# Patient Record
Sex: Female | Born: 1935 | Race: White | Hispanic: No | Marital: Married | Smoking: Never smoker
Health system: Southern US, Community
[De-identification: ages and names within clinical notes are randomized; demographics above are authoritative.]

## PROBLEM LIST (undated history)

## (undated) DIAGNOSIS — F0390 Unspecified dementia without behavioral disturbance: Secondary | ICD-10-CM

## (undated) DIAGNOSIS — I10 Essential (primary) hypertension: Secondary | ICD-10-CM

## (undated) DIAGNOSIS — E119 Type 2 diabetes mellitus without complications: Secondary | ICD-10-CM

## (undated) DIAGNOSIS — E039 Hypothyroidism, unspecified: Secondary | ICD-10-CM

## (undated) HISTORY — PX: TONSILLECTOMY: SUR1361

## (undated) HISTORY — PX: EYE SURGERY: SHX253

## (undated) HISTORY — PX: APPENDECTOMY: SHX54

## (undated) HISTORY — PX: MELANOMA EXCISION: SHX5266

## (undated) HISTORY — PX: CHOLECYSTECTOMY: SHX55

---

## 2004-06-13 ENCOUNTER — Ambulatory Visit: Payer: Self-pay | Admitting: Gastroenterology

## 2004-06-20 ENCOUNTER — Ambulatory Visit: Payer: Self-pay | Admitting: Unknown Physician Specialty

## 2004-10-11 ENCOUNTER — Ambulatory Visit: Payer: Self-pay | Admitting: Unknown Physician Specialty

## 2005-11-13 ENCOUNTER — Ambulatory Visit: Payer: Self-pay | Admitting: Unknown Physician Specialty

## 2006-11-25 ENCOUNTER — Ambulatory Visit: Payer: Self-pay | Admitting: Unknown Physician Specialty

## 2007-12-03 ENCOUNTER — Ambulatory Visit: Payer: Self-pay | Admitting: Unknown Physician Specialty

## 2008-12-07 ENCOUNTER — Ambulatory Visit: Payer: Self-pay | Admitting: Unknown Physician Specialty

## 2009-02-18 ENCOUNTER — Emergency Department: Payer: Self-pay | Admitting: Emergency Medicine

## 2009-05-22 ENCOUNTER — Ambulatory Visit: Payer: Self-pay | Admitting: Unknown Physician Specialty

## 2009-08-15 ENCOUNTER — Ambulatory Visit: Payer: Self-pay | Admitting: Ophthalmology

## 2009-08-21 ENCOUNTER — Ambulatory Visit: Payer: Self-pay | Admitting: Ophthalmology

## 2009-09-23 ENCOUNTER — Emergency Department: Payer: Self-pay | Admitting: Unknown Physician Specialty

## 2009-12-12 ENCOUNTER — Ambulatory Visit: Payer: Self-pay | Admitting: Unknown Physician Specialty

## 2010-07-07 IMAGING — CR DG SHOULDER 1V*R*
1 series · 3 of 3 positions shown · non-contrast
Comparison: none

REASON FOR EXAM: post reduction
COMMENTS:

PROCEDURE:     DXR - DXR SHOULDER RIGHT ONE VIEW  - September 23, 2009 [DATE]
RESULT:     Two views of the right shoulder are submitted. The dislocation
previously demonstrated is less conspicuous but still felt to be present.
There remains narrowing of the subacromial subdeltoid space.

[Series 1: view not recorded · 0.17mm/px · 3 of 3 slices shown]
[im 1/3]
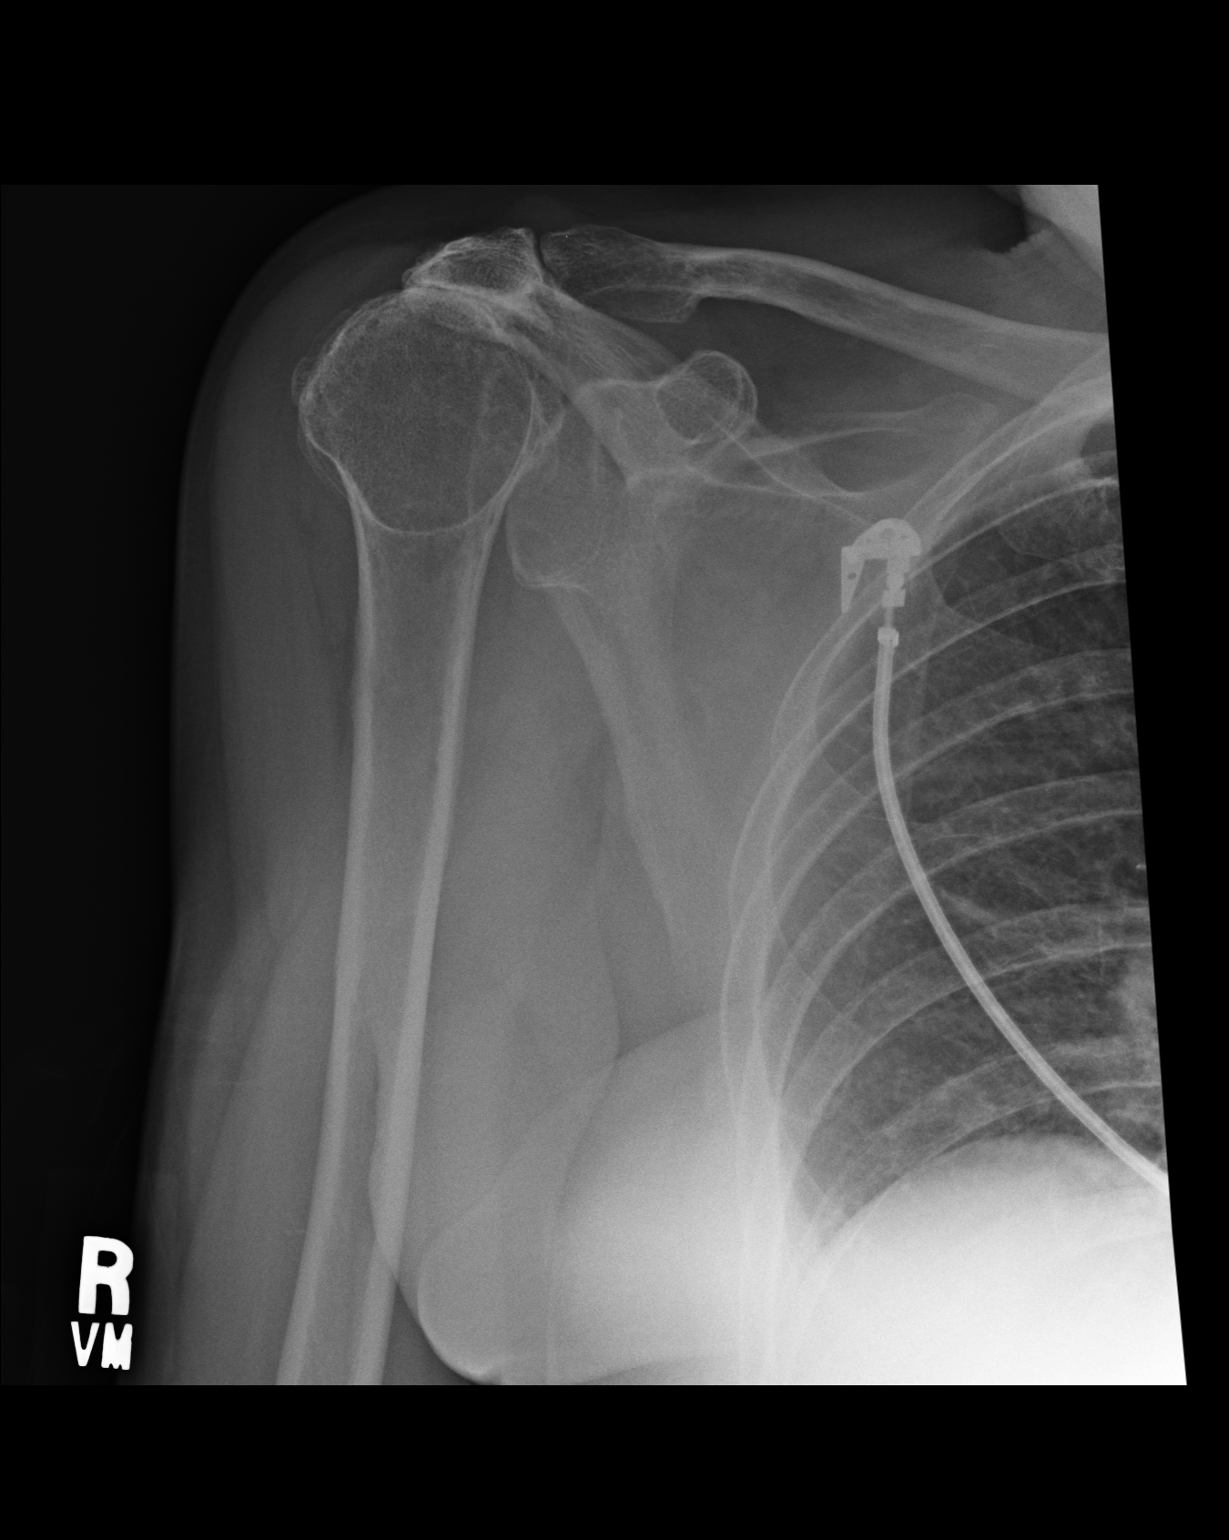
[im 2/3]
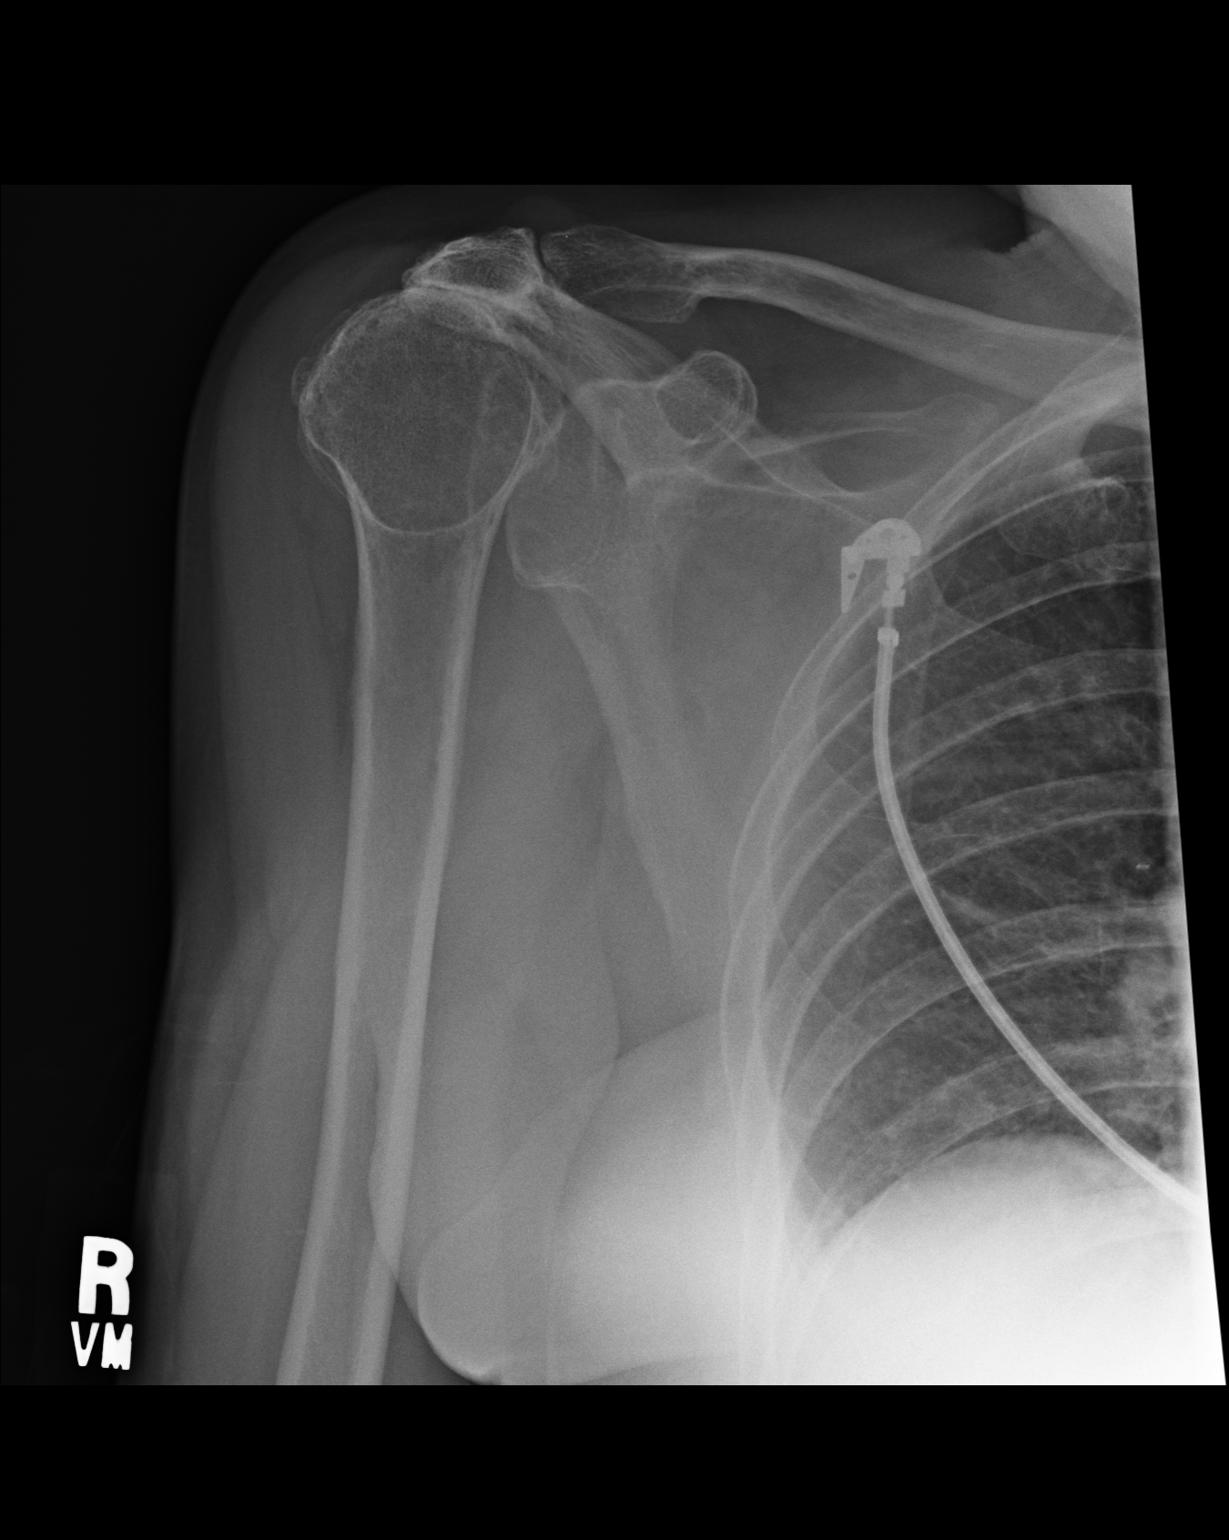
[im 3/3]
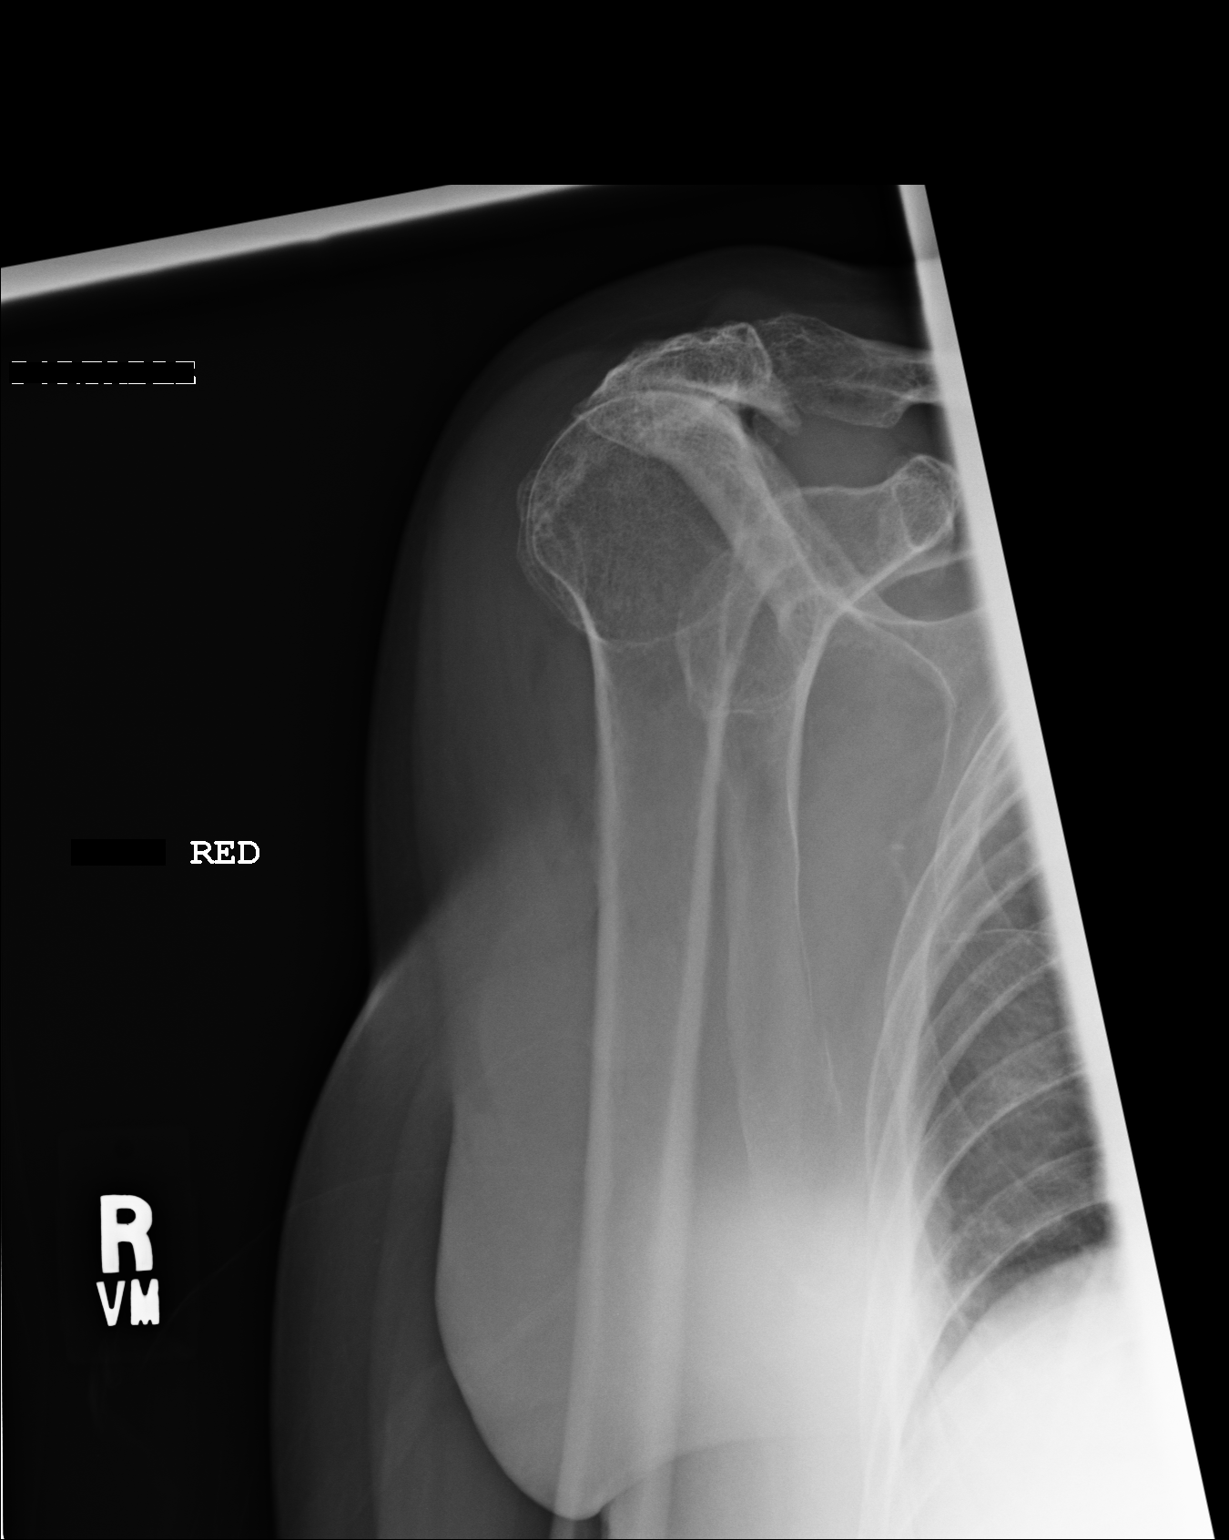

[3 of 3 positions shown; findings below may reference images not displayed]

IMPRESSION: The previously posteriorly dislocated right shoulder is not
clearly relocated.

## 2011-01-01 ENCOUNTER — Ambulatory Visit: Payer: Self-pay | Admitting: Unknown Physician Specialty

## 2011-05-13 ENCOUNTER — Emergency Department: Payer: Self-pay | Admitting: *Deleted

## 2011-05-13 LAB — COMPREHENSIVE METABOLIC PANEL
Anion Gap: 12 (ref 7–16)
Bilirubin,Total: 0.6 mg/dL (ref 0.2–1.0)
Chloride: 105 mmol/L (ref 98–107)
EGFR (African American): >60
Osmolality: 291 (ref 275–301)
Potassium: 4 mmol/L (ref 3.5–5.1)
SGOT(AST): 38 U/L — ABNORMAL HIGH (ref 15–37)
SGPT (ALT): 30 U/L
Total Protein: 7.2 g/dL (ref 6.4–8.2)

## 2011-05-13 LAB — CBC
HGB: 12.7 g/dL (ref 12.0–16.0)
MCH: 30.9 pg (ref 26.0–34.0)
MCV: 93 fL (ref 80–100)
Platelet: 162 10*3/uL (ref 150–440)
RBC: 4.13 10*6/uL (ref 3.80–5.20)
WBC: 4.2 10*3/uL (ref 3.6–11.0)

## 2011-05-13 LAB — TROPONIN I: Troponin-I: <0.02 ng/mL

## 2011-05-13 LAB — PRO B NATRIURETIC PEPTIDE: B-Type Natriuretic Peptide: 268 pg/mL (ref 0–450)

## 2011-05-13 LAB — CK TOTAL AND CKMB (NOT AT ARMC)
CK, Total: 233 U/L — ABNORMAL HIGH (ref 21–215)
CK-MB: 4.2 ng/mL — ABNORMAL HIGH (ref 0.5–3.6)

## 2011-06-11 ENCOUNTER — Encounter: Payer: Self-pay | Admitting: Rheumatology

## 2011-09-27 ENCOUNTER — Emergency Department: Payer: Self-pay | Admitting: *Deleted

## 2011-09-27 LAB — URINALYSIS, COMPLETE
Bilirubin,UR: NEGATIVE
Blood: NEGATIVE
Glucose,UR: NEGATIVE mg/dL
Hyaline Cast: 12
Ketone: NEGATIVE
Nitrite: NEGATIVE
Ph: 6
Protein: NEGATIVE
RBC,UR: NONE SEEN /HPF
Specific Gravity: 1.009
Squamous Epithelial: <1
Transitional Epi: <1
WBC UR: 30 /HPF

## 2011-09-27 LAB — CK TOTAL AND CKMB (NOT AT ARMC)
CK, Total: 221 U/L — ABNORMAL HIGH (ref 21–215)
CK-MB: 5.6 ng/mL — ABNORMAL HIGH (ref 0.5–3.6)

## 2011-09-27 LAB — COMPREHENSIVE METABOLIC PANEL
Albumin: 3.9 g/dL (ref 3.4–5.0)
Alkaline Phosphatase: 69 U/L (ref 50–136)
Anion Gap: 11 (ref 7–16)
Bilirubin,Total: 0.4 mg/dL (ref 0.2–1.0)
Co2: 21 mmol/L (ref 21–32)
EGFR (African American): 42 — ABNORMAL LOW
Osmolality: 252 (ref 275–301)
SGOT(AST): 37 U/L (ref 15–37)
SGPT (ALT): 33 U/L
Sodium: 124 mmol/L — ABNORMAL LOW (ref 136–145)
Total Protein: 6.7 g/dL (ref 6.4–8.2)

## 2011-09-27 LAB — TROPONIN I: Troponin-I: <0.02 ng/mL

## 2011-09-27 LAB — CBC
HCT: 34 % — ABNORMAL LOW (ref 35.0–47.0)
MCV: 88 fL (ref 80–100)
Platelet: 212 10*3/uL (ref 150–440)
RDW: 13.4 % (ref 11.5–14.5)
WBC: 4.9 10*3/uL (ref 3.6–11.0)

## 2011-09-27 LAB — TSH: Thyroid Stimulating Horm: 1.67 u[IU]/mL

## 2011-09-28 LAB — URINE CULTURE

## 2012-01-28 ENCOUNTER — Ambulatory Visit: Payer: Self-pay | Admitting: Physician Assistant

## 2012-03-17 ENCOUNTER — Other Ambulatory Visit: Payer: Self-pay | Admitting: Gastroenterology

## 2012-03-17 LAB — WBCS, STOOL

## 2012-05-06 ENCOUNTER — Ambulatory Visit: Payer: Self-pay | Admitting: Gastroenterology

## 2012-05-07 LAB — PATHOLOGY REPORT

## 2012-06-16 ENCOUNTER — Encounter: Payer: Self-pay | Admitting: Neurology

## 2012-06-20 ENCOUNTER — Encounter: Payer: Self-pay | Admitting: Neurology

## 2012-07-21 ENCOUNTER — Encounter: Payer: Self-pay | Admitting: Neurology

## 2013-02-02 ENCOUNTER — Ambulatory Visit: Payer: Self-pay | Admitting: Physician Assistant

## 2013-04-20 ENCOUNTER — Ambulatory Visit: Payer: Self-pay | Admitting: Gastroenterology

## 2014-02-03 ENCOUNTER — Ambulatory Visit: Payer: Self-pay | Admitting: Physician Assistant

## 2014-04-24 ENCOUNTER — Emergency Department: Payer: Self-pay | Admitting: Emergency Medicine

## 2014-04-24 LAB — URINALYSIS, COMPLETE
Bacteria: NONE SEEN
Bilirubin,UR: NEGATIVE
Blood: NEGATIVE
Glucose,UR: NEGATIVE mg/dL (ref 0–75)
Ketone: NEGATIVE
NITRITE: NEGATIVE
PROTEIN: NEGATIVE
Ph: 5 (ref 4.5–8.0)
SPECIFIC GRAVITY: 1.014 (ref 1.003–1.030)
Squamous Epithelial: 1

## 2014-04-24 LAB — CBC WITH DIFFERENTIAL/PLATELET
Basophil #: 0.1 10*3/uL (ref 0.0–0.1)
Basophil %: 1 %
EOS ABS: 0.1 10*3/uL (ref 0.0–0.7)
Eosinophil %: 1.9 %
HCT: 35.7 % (ref 35.0–47.0)
HGB: 11.6 g/dL — ABNORMAL LOW (ref 12.0–16.0)
LYMPHS ABS: 1 10*3/uL (ref 1.0–3.6)
Lymphocyte %: 18.1 %
MCH: 30.1 pg (ref 26.0–34.0)
MCHC: 32.6 g/dL (ref 32.0–36.0)
MCV: 92 fL (ref 80–100)
MONO ABS: 0.3 x10 3/mm (ref 0.2–0.9)
MONOS PCT: 6 %
NEUTROS ABS: 4.1 10*3/uL (ref 1.4–6.5)
Neutrophil %: 73 %
PLATELETS: 227 10*3/uL (ref 150–440)
RBC: 3.87 10*6/uL (ref 3.80–5.20)
RDW: 13.2 % (ref 11.5–14.5)
WBC: 5.6 10*3/uL (ref 3.6–11.0)

## 2014-04-24 LAB — BASIC METABOLIC PANEL
ANION GAP: 8 (ref 7–16)
BUN: 19 mg/dL — ABNORMAL HIGH (ref 7–18)
CO2: 25 mmol/L (ref 21–32)
CREATININE: 1.36 mg/dL — AB (ref 0.60–1.30)
Calcium, Total: 8.5 mg/dL (ref 8.5–10.1)
Chloride: 106 mmol/L (ref 98–107)
GFR CALC AF AMER: 48 — AB
GFR CALC NON AF AMER: 39 — AB
GLUCOSE: 154 mg/dL — AB (ref 65–99)
Osmolality: 283 (ref 275–301)
Potassium: 4.1 mmol/L (ref 3.5–5.1)
Sodium: 139 mmol/L (ref 136–145)

## 2014-04-24 LAB — TROPONIN I: Troponin-I: <0.02 ng/mL

## 2014-05-09 ENCOUNTER — Encounter: Payer: Self-pay | Admitting: Physician Assistant

## 2014-05-23 ENCOUNTER — Encounter: Payer: Self-pay | Admitting: Physician Assistant

## 2014-06-21 ENCOUNTER — Encounter
Admit: 2014-06-21 | Disposition: A | Discharge status: Home / Self Care | Payer: Self-pay | Attending: Physician Assistant | Admitting: Physician Assistant

## 2014-07-22 ENCOUNTER — Encounter
Admit: 2014-07-22 | Disposition: A | Discharge status: Home / Self Care | Payer: Self-pay | Attending: Physician Assistant | Admitting: Physician Assistant

## 2015-01-05 ENCOUNTER — Emergency Department: Payer: Medicare Other

## 2015-01-05 ENCOUNTER — Other Ambulatory Visit: Payer: Self-pay | Admitting: Emergency Medicine

## 2015-01-05 ENCOUNTER — Inpatient Hospital Stay
Admission: EM | Admit: 2015-01-05 | Discharge: 2015-01-09 | DRG: 494 | Disposition: A | Discharge status: Skilled Nursing Facility | Payer: Medicare Other | Attending: Internal Medicine | Admitting: Internal Medicine

## 2015-01-05 ENCOUNTER — Encounter: Payer: Self-pay | Admitting: Internal Medicine

## 2015-01-05 DIAGNOSIS — M25572 Pain in left ankle and joints of left foot: Secondary | ICD-10-CM | POA: Diagnosis present

## 2015-01-05 DIAGNOSIS — Z809 Family history of malignant neoplasm, unspecified: Secondary | ICD-10-CM

## 2015-01-05 DIAGNOSIS — Z8249 Family history of ischemic heart disease and other diseases of the circulatory system: Secondary | ICD-10-CM | POA: Diagnosis not present

## 2015-01-05 DIAGNOSIS — R519 Headache, unspecified: Secondary | ICD-10-CM

## 2015-01-05 DIAGNOSIS — S82852A Displaced trimalleolar fracture of left lower leg, initial encounter for closed fracture: Principal | ICD-10-CM | POA: Diagnosis present

## 2015-01-05 DIAGNOSIS — E039 Hypothyroidism, unspecified: Secondary | ICD-10-CM | POA: Diagnosis not present

## 2015-01-05 DIAGNOSIS — Z9181 History of falling: Secondary | ICD-10-CM

## 2015-01-05 DIAGNOSIS — E119 Type 2 diabetes mellitus without complications: Secondary | ICD-10-CM | POA: Diagnosis not present

## 2015-01-05 DIAGNOSIS — I10 Essential (primary) hypertension: Secondary | ICD-10-CM | POA: Diagnosis not present

## 2015-01-05 DIAGNOSIS — Z419 Encounter for procedure for purposes other than remedying health state, unspecified: Secondary | ICD-10-CM

## 2015-01-05 DIAGNOSIS — S80812A Abrasion, left lower leg, initial encounter: Secondary | ICD-10-CM

## 2015-01-05 DIAGNOSIS — F0390 Unspecified dementia without behavioral disturbance: Secondary | ICD-10-CM | POA: Diagnosis present

## 2015-01-05 DIAGNOSIS — Z8781 Personal history of (healed) traumatic fracture: Secondary | ICD-10-CM

## 2015-01-05 DIAGNOSIS — Z9889 Other specified postprocedural states: Secondary | ICD-10-CM

## 2015-01-05 DIAGNOSIS — R51 Headache: Secondary | ICD-10-CM

## 2015-01-05 DIAGNOSIS — W19XXXA Unspecified fall, initial encounter: Secondary | ICD-10-CM | POA: Diagnosis present

## 2015-01-05 DIAGNOSIS — E785 Hyperlipidemia, unspecified: Secondary | ICD-10-CM | POA: Diagnosis not present

## 2015-01-05 HISTORY — DX: Hypothyroidism, unspecified: E03.9

## 2015-01-05 HISTORY — DX: Unspecified dementia, unspecified severity, without behavioral disturbance, psychotic disturbance, mood disturbance, and anxiety: F03.90

## 2015-01-05 HISTORY — DX: Type 2 diabetes mellitus without complications: E11.9

## 2015-01-05 HISTORY — DX: Essential (primary) hypertension: I10

## 2015-01-05 LAB — CBC
HEMATOCRIT: 35.1 % (ref 35.0–47.0)
Hemoglobin: 11.6 g/dL — ABNORMAL LOW (ref 12.0–16.0)
MCH: 30.2 pg (ref 26.0–34.0)
MCHC: 33 g/dL (ref 32.0–36.0)
MCV: 91.4 fL (ref 80.0–100.0)
Platelets: 205 10*3/uL (ref 150–440)
RBC: 3.84 MIL/uL (ref 3.80–5.20)
RDW: 13.5 % (ref 11.5–14.5)
WBC: 8.8 10*3/uL (ref 3.6–11.0)

## 2015-01-05 LAB — PROTIME-INR
INR: 0.98
Prothrombin Time: 13.2 seconds (ref 11.4–15.0)

## 2015-01-05 LAB — APTT: aPTT: 30 seconds (ref 24–36)

## 2015-01-05 LAB — BASIC METABOLIC PANEL
ANION GAP: 7 (ref 5–15)
BUN: 29 mg/dL — ABNORMAL HIGH (ref 6–20)
CALCIUM: 9 mg/dL (ref 8.9–10.3)
CO2: 26 mmol/L (ref 22–32)
Chloride: 103 mmol/L (ref 101–111)
Creatinine, Ser: 1.31 mg/dL — ABNORMAL HIGH (ref 0.44–1.00)
GFR calc Af Amer: 41 mL/min — ABNORMAL LOW (ref >60)
GFR calc non Af Amer: 36 mL/min — ABNORMAL LOW (ref >60)
GLUCOSE: 175 mg/dL — AB (ref 65–99)
Potassium: 4.3 mmol/L (ref 3.5–5.1)
Sodium: 136 mmol/L (ref 135–145)

## 2015-01-05 MED ORDER — TETANUS-DIPHTH-ACELL PERTUSSIS 5-2.5-18.5 LF-MCG/0.5 IM SUSP
0.5000 mL | Freq: Once | INTRAMUSCULAR | Status: AC
  Administered 2015-01-05: 0.5 mL via INTRAMUSCULAR
  Filled 2015-01-05: qty 0.5

## 2015-01-05 NOTE — H&P (Signed)
[facility] - [place] at [facility]   PATIENT NAME: Sara Delgado    MR#:  [id number]  DATE OF BIRTH:  1926/09/02  DATE OF ADMISSION:  01/05/2015  PRIMARY CARE PHYSICIAN: No primary care provider on file.   REQUESTING/REFERRING PHYSICIAN: Dr. Sharma Delgado  CHIEF COMPLAINT:   Chief Complaint  Patient presents with  . Ankle Pain    HISTORY OF PRESENT ILLNESS:  Sara Delgado  is a 81 y.o. female with a known history of hypertension, dementia, diet-controlled diabetes mellitus, hyperlipidemia, hypothyroidism, history of recurrent falls presents to the emergency room with the complaint of left ankle pain and swelling following mechanical fall sustained while taking a shower in the afternoon today. No history of any loss of consciousness, chest pain, shortness of breath, dizziness preceding or following the fall. She did have some headache following fall which resolved after taking Tylenol. Evaluation in the ED revealed mildly displaced trimalleolar fracture of left ankle. Ortho on call was consulted by the ED physician, evaluated by Ortho and placed on cast. Since patient could not bear weight and with history of recurrent falls and potential for further falls, it was decided by the ED physician to admit the patient to medicine service for further care including further evaluation by Ortho in the morning for possible surgery since post reduction x-rays are not satisfactory for the ortho. Patient also underwent a CT head which was unremarkable. Hospitalist service was consulted for further management. Patient is comfortably resting in the bed at this time and denies any complaints such as dizziness, headache, visual disturbances, chest pain, shortness of breath.  PAST MEDICAL HISTORY:   Past Medical History  Diagnosis Date  . Hypertension   . Diabetes mellitus without complication   . Hypothyroidism   . Dementia     PAST SURGICAL HISTORY:   Past Surgical History  Procedure  Laterality Date  . Appendectomy    . Eye surgery    . Tonsillectomy    . Cholecystectomy      SOCIAL HISTORY:   Social History  Substance Use Topics  . Smoking status: Never Smoker   . Smokeless tobacco: Not on file  . Alcohol Use: No    FAMILY HISTORY:   Family History  Problem Relation Age of Onset  . CAD Mother   . CAD Father   . Dementia Sister   . CAD Brother   . Cancer Brother     DRUG ALLERGIES:  Not on File  REVIEW OF SYSTEMS:   Review of Systems  Constitutional: Negative for fever, chills and malaise/fatigue.  HENT: Negative for ear pain, hearing loss, nosebleeds, sore throat and tinnitus.   Eyes: Negative for blurred vision, double vision, pain, discharge and redness.  Respiratory: Negative for cough, hemoptysis, sputum production, shortness of breath and wheezing.   Cardiovascular: Negative for chest pain, palpitations, orthopnea and leg swelling.  Gastrointestinal: Negative for nausea, vomiting, abdominal pain, diarrhea, constipation, blood in stool and melena.  Genitourinary: Negative for dysuria, urgency, frequency and hematuria.  Musculoskeletal: Positive for joint pain. Negative for back pain and neck pain.       Left ankle pain and swelling following mechanical fall as noted in history of present illness  Skin: Negative for itching and rash.  Neurological: Negative for dizziness, tingling, sensory change, focal weakness and seizures.  Endo/Heme/Allergies: Does not bruise/bleed easily.  Psychiatric/Behavioral: Positive for memory loss. Negative for depression. The patient is not nervous/anxious.     MEDICATIONS AT HOME:   Prior  to Admission medications   Not on File      VITAL SIGNS:  Blood pressure 125/68, pulse 66, temperature 98.1 F (36.7 C), temperature source Oral, resp. rate 16, height  (1.676 m), weight 61.236 kg (135 lb), SpO2 100 %.  PHYSICAL EXAMINATION:  Physical Exam  Constitutional: She is oriented to person, place, and  time. She appears well-developed and well-nourished. No distress.  HENT:  Head: Normocephalic and atraumatic.  Right Ear: External ear normal.  Left Ear: External ear normal.  Nose: Nose normal.  Mouth/Throat: Oropharynx is clear and moist. No oropharyngeal exudate.  Eyes: EOM are normal. Pupils are equal, round, and reactive to light. No scleral icterus.  Neck: Normal range of motion. Neck supple. No JVD present. No thyromegaly present.  Cardiovascular: Normal rate, regular rhythm, normal heart sounds and intact distal pulses.  Exam reveals no friction rub.   No murmur heard. Respiratory: Effort normal and breath sounds normal. No respiratory distress. She has no wheezes. She has no rales. She exhibits no tenderness.  GI: Soft. Bowel sounds are normal. She exhibits no distension and no mass. There is no tenderness. There is no rebound and no guarding.  Musculoskeletal: She exhibits no edema.  Left ankle and leg is in cast.  Lymphadenopathy:    She has no cervical adenopathy.  Neurological: She is alert and oriented to person, place, and time. She has normal reflexes. She displays normal reflexes. No cranial nerve deficit. She exhibits normal muscle tone.  Skin: Skin is warm. No rash noted. No erythema.  Psychiatric: She has a normal mood and affect. Her behavior is normal. Thought content normal.   LABORATORY PANEL:   CBC  Recent Labs Lab 01/05/15 2042  WBC 8.8  HGB 11.6*  HCT 35.1  PLT 205   ------------------------------------------------------------------------------------------------------------------  Chemistries   Recent Labs Lab 01/05/15 2042  NA 136  K 4.3  CL 103  CO2 26  GLUCOSE 175*  BUN 29*  CREATININE 1.31*  CALCIUM 9.0   ------------------------------------------------------------------------------------------------------------------  Cardiac Enzymes No results for input(s): TROPONINI in the last 168  hours. ------------------------------------------------------------------------------------------------------------------  RADIOLOGY:  Dg Ankle Complete Left  01/05/2015   CLINICAL DATA:  Post reduction.  EXAM: LEFT ANKLE COMPLETE - 3+ VIEW  COMPARISON:  Earlier today.  FINDINGS: Overlying cast obscures evaluation of bony detail. Minimally displaced medial malleolar fracture unchanged. Min displaced oblique fracture of the distal fibular diametaphyseal region without significant change. For a nondisplaced posterior malleolar fracture without significant change. Slight widening of the medial aspect of the ankle mortise unchanged.  IMPRESSION: Minimally displaced trimalleolar fracture without significant change post reduction and casting. Slight widening of the medial aspect of the ankle mortise unchanged.   Electronically Signed   By: Elberta Fortis M.D.   On: 01/05/2015 23:24   Dg Ankle Complete Left  01/05/2015   CLINICAL DATA:  Left ankle pain after fall today. Fall in the shower, now with swelling and bruising. Initial encounter.  EXAM: LEFT ANKLE COMPLETE - 3+ VIEW  COMPARISON:  None.  FINDINGS: Oblique mildly displaced fracture of the distal fibula proximal to the ankle mortise. Mildly displaced medial malleolar fracture. Cortical irregularity of the posterior tibial tubercle consistent with nondisplaced fracture. The bones are under mineralized. There is widening of the medial clear space. There is diffuse soft tissue edema about the ankle.  IMPRESSION: Mildly displaced trimalleolar fracture.   Electronically Signed   By: Rubye Oaks M.D.   On: 01/05/2015 21:01   Ct Head Wo  Contrast  01/05/2015   CLINICAL DATA:  81 year old female post fall. No loss of consciousness.  EXAM: CT HEAD WITHOUT CONTRAST  TECHNIQUE: Contiguous axial images were obtained from the base of the skull through the vertex without intravenous contrast.  COMPARISON:  04/24/2014  FINDINGS: Age-related atrophy, unchanged. No  intracranial hemorrhage, mass effect, or midline shift. No hydrocephalus. The basilar cisterns are patent. No evidence of territorial infarct. No intracranial fluid collection. Calvarium is intact. Included paranasal sinuses and mastoid air cells are well aerated.  IMPRESSION: No acute intracranial abnormality.   Electronically Signed   By: Rubye Oaks M.D.   On: 01/05/2015 23:16    EKG:   Orders placed or performed during the hospital encounter of 01/05/15  . ED EKG  . ED EKG  . EKG 12-Lead  . EKG 12-Lead  Normal sinus rhythm with ventricular rate of 60 bpm, left axis deviation. No acute ischemic changes.  IMPRESSION AND PLAN:   81 year old Caucasian female with history of hypertension, dementia, diet-controlled diabetes mellitus presents to the emergency room with the complaints of left ankle pain and swelling following mechanical fall, found to have mildly displaced trimalleolar fracture left ankle. 1. Trimalleolar fracture left ankle following mechanical fall, evaluated by ortho on-call, placed on Cast. Patient not able to bear weight, admitted for further continuation of care including for possible surgery in the morning. Plan: Admit, place nothing by mouth after midnight for possible surgery in the morning, orthopedic follow-up, pain control meds as needed. 2. Hypertension, stable on home medications. 3. Dementia, stable on home medications.   4. Diet-controlled diabetes mellitus, stable clinically. Sliding scale insulin, follow blood sugars closely. 5. Hyperlipidemia, stable on home medications. 6. Hypothyroidism, stable on home medications. Obtain list of home medications and restart home medications. DVT prophylaxis: We will not place on subcutaneous heparin since orthopedic planning for possible surgery in the morning. We will order SCDs.    All the records are reviewed and case discussed with ED provider. Management plans discussed with the patient, family and they are in  agreement.  CODE STATUS: full code  TOTAL TIME TAKING CARE OF THIS PATIENT: 50 minutes.    Crissie Figures M.D on 01/05/2015 at 11:49 PM  Between 7am to 6pm - Pager - [phone]  After 6pm go to [email or web address] - password [email or web address] [facility]  [facility]  Office  [phone]  CC: Primary care physician; No primary care provider on file.

## 2015-01-05 NOTE — Consult Note (Signed)
ORTHOPAEDIC CONSULTATION  REQUESTING PHYSICIAN: Rockne Menghini, MD  Chief Complaint:   Left ankle pain.  History of Present Illness: Sara Delgado is a 81 y.o. female with a history of early memory loss and poor balance who lives independently at home with her husband. Apparently, while taking a shower this afternoon around 3:30, she had the urge to go to the bathroom. While trying to get out of the shower, she slipped and fell, injuring her ankle. Her husband found her in the shower. The patient was complaining of a headache and appeared pale. The patient's husband waited approximately 15-20 minutes until she "got her strength back" that helped her into the study and covered her with some towels to keep her warm. The patient felt better and insisted that she go out to dinner with her daughter as planned. Because of continued left ankle pain, she was brought to the emergency room by her family where x-rays demonstrated a minimally displaced trimalleolar fracture dislocation of her left ankle. The patient denies any actual loss of consciousness as a result of the fall and denies any other injuries resulting from the fall. She denies any lightheadedness, dizziness, or other symptoms that may have predisposed her to the fall.  No past medical history on file. No past surgical history on file. Social History   Social History  . Marital Status: Married    Spouse Name: N/A  . Number of Children: N/A  . Years of Education: N/A   Social History Main Topics  . Smoking status: Not on file  . Smokeless tobacco: Not on file  . Alcohol Use: Not on file  . Drug Use: Not on file  . Sexual Activity: Not on file   Other Topics Concern  . Not on file   Social History Narrative  . No narrative on file   No family history on file. Allergies not on file Prior to Admission medications   Not on File   Dg Ankle Complete  Left  01/05/2015   CLINICAL DATA:  Left ankle pain after fall today. Fall in the shower, now with swelling and bruising. Initial encounter.  EXAM: LEFT ANKLE COMPLETE - 3+ VIEW  COMPARISON:  None.  FINDINGS: Oblique mildly displaced fracture of the distal fibula proximal to the ankle mortise. Mildly displaced medial malleolar fracture. Cortical irregularity of the posterior tibial tubercle consistent with nondisplaced fracture. The bones are under mineralized. There is widening of the medial clear space. There is diffuse soft tissue edema about the ankle.  IMPRESSION: Mildly displaced trimalleolar fracture.   Electronically Signed   By: Rubye Oaks M.D.   On: 01/05/2015 21:01   Positive ROS: All other systems have been reviewed and were otherwise negative with the exception of those mentioned in the HPI and as above.  Physical Exam: General:  Alert, no acute distress Psychiatric:  Patient is competent for consent with normal mood and affect   Cardiovascular:  No pedal edema Respiratory:  No wheezing, non-labored breathing GI:  Abdomen is soft and non-tender Skin:  No lesions in the area of chief complaint Neurologic:  Sensation intact distally Lymphatic:  No axillary or cervical lymphadenopathy  Orthopedic Exam:  Orthopedic examination is limited to the left lower extremity and foot. There is an area of mild bruising over the medial malleolus, but there are no areas of lacerations, abrasions, or rashes over the medial or lateral malleolar regions. She does have a very small superficial laceration over the mid shin anteriorly, approximately 6  inches above the ankle. She has moderate tenderness to palpation over the medial and lateral malleolar regions, as well as pain with any attempted active or passive motion of the ankle. She is able to actively dorsiflex and plantarflex her toes. She has intact sensation to light touch over all distributions of her foot, and has excellent capillary refill to  all digits.  X-rays:  AP, lateral, and oblique films of the left ankle are available for review. These films demonstrate a mildly displaced trimalleolar fracture of the left ankle with several millimeters of lateral translation of the talus from beneath the tibia.  Assessment: Mildly displaced trimalleolar fracture dislocation left ankle.  Plan: The treatment options are discussed with the patient and her family. She would like to try to proceed with nonsurgical intervention if at all possible, given her age. Therefore, after obtaining verbal consent, the patient's ankle is gently reduced and stabilized in a posterior splint with a sugar tong supplement. The patient is advised to keep her leg elevated as much as possible and to remain nonweightbearing.    [name], MD  Beeper #:  [phone]  01/05/2015 9:55 PM

## 2015-01-05 NOTE — ED Notes (Signed)
Pt to ED c/o falling in shower that caused her to have noted swelling and bruising to medial side of L ankle.  Pt denies any loss of consciousness.

## 2015-01-05 NOTE — ED Provider Notes (Addendum)
[facility] Emergency Department Provider Note  ____________________________________________  Time seen: Approximately 10:09 PM  I have reviewed the triage vital signs and the nursing notes.   HISTORY  Chief Complaint Ankle Pain  History is minimally limited by dementia, there are some portions of tonight's fall that the patient does not remember. However, her husband who accompanies her did find her immediately and can describe the events that occurred after the fall.  HPI Sara Delgado is a 81 y.o. female with a history of dementia and recurrent falls, who lives independently with her husband, is presents today with left ankle pain after fall. The patient describes that she was in the shower around 3 PM and wanted to have a bowel movement, and slipped as she was attempting to get out of the shower. She does not recall that she hit her head but does not remember the exact chain of events. Her husband found her in the bathroom on the floor floor, stating that she was pale but did know who she was and regained enough strength to help stand up within approximately 15 minutes. At that time, the patient was complaining of a headache, which has completely resolved with 650 mg of Tylenol. The patient has had some burping this evening, but has not had any more headache, visual changes, speech changes, numbness tingling or weakness, chest pain, shortness of breath, or recent illness.. She is able to bear weight on her left ankle. After the fall, she went out to dinner with her daughter and was able to eat her entire meal.  The patient and her daughter do not know her medications, so it is unclear whether she is anticoagulated at the family does not think so.   No past medical history on file.  There are no active problems to display for this patient.   No past surgical history on file.  No current outpatient prescriptions on file.  Allergies Review of patient's allergies  indicates not on file.  No family history on file.  Social History Social History  Substance Use Topics  . Smoking status: Not on file  . Smokeless tobacco: Not on file  . Alcohol Use: Not on file    Review of Systems Constitutional: No fever/chills Eyes: No visual changes. ENT: No sore throat. No neck pain. Cardiovascular: Denies chest pain, palpitations. No lightheadedness, syncope. Respiratory: Denies shortness of breath.  No cough. Gastrointestinal: No abdominal pain.  No nausea, no vomiting.  No diarrhea.  No constipation. Genitourinary: Negative for dysuria. Musculoskeletal: Negative for back pain. Plus left ankle pain. Skin: Negative for rash. Neurological: Negative for headaches, focal weakness or numbness.  10-point ROS otherwise negative.  ____________________________________________   PHYSICAL EXAM:  VITAL SIGNS: ED Triage Vitals  Enc Vitals Group     BP 01/05/15 2021 137/59 mmHg     Pulse Rate 01/05/15 2021 63     Resp 01/05/15 2021 18     Temp 01/05/15 2021 98.1 F (36.7 C)     Temp Source 01/05/15 2021 Oral     SpO2 01/05/15 2021 100 %     Weight 01/05/15 2021 135 lb (61.236 kg)     Height 01/05/15 2021  (1.676 m)     Head Cir --      [facility]     Pain Loc --      Pain Edu? --      Excl. in GC? --  Constitutional: Alert and oriented. Well appearing and in no acute distress.  Eyes: Conjunctivae are normal.  EOMI. no raccoon eyes. Head: Atraumatic. No Battle sign. Bilateral TMs are clear without hemotympanum. No evidence of malocclusion or dental injury. No septal hematoma. Nose: No congestion/rhinnorhea. Mouth/Throat: Mucous membranes are moist.  Neck: No stridor.  Supple.  No midline C-spine tenderness. Full range of motion without any pain. Cardiovascular: Normal rate, regular rhythm. No murmurs, rubs or gallops.  Respiratory: Normal respiratory effort.  No retractions. Lungs CTAB.  No wheezes, rales  or ronchi. Gastrointestinal: Soft and nontender. No distention. No peritoneal signs. Musculoskeletal: Left ankle shows some swelling more prominent over the lateral malleolus and the medial malleolus. The patient has some pain with range of motion. Full range of motion of the left knee and left hip without pain. Skin over the ankle is intact. Patient does have a small abrasion over the distal tibia that is superficial. Normal DP and PT pulses normal sensation to light touch over the tire left lower extremity. Neurologic:  Normal speech and language. No gross focal neurologic deficits are appreciated.  Skin:  Skin is warm, dry and intact. No rash noted. Psychiatric: Mood and affect are normal. Speech and behavior are normal.  ____________________________________________   LABS (all labs ordered are listed, but only abnormal results are displayed)  Labs Reviewed  URINALYSIS COMPLETEWITH MICROSCOPIC ([facility] ONLY)  CBC  BASIC METABOLIC PANEL   ____________________________________________  EKG  ED ECG REPORT I, Rockne Menghini, the attending physician, personally viewed and interpreted this ECG.   Date: 01/05/2015  EKG Time: 20:38   Rate: 60  Rhythm: normal EKG, normal sinus rhythm, unchanged from previous tracings, normal sinus rhythm  Axis: Leftward  Intervals:none  ST&T Change: Nonspecific T-wave inversions V1. No evidence of acute ischemic changes.  ____________________________________________  RADIOLOGY  Dg Ankle Complete Left  01/05/2015   CLINICAL DATA:  Left ankle pain after fall today. Fall in the shower, now with swelling and bruising. Initial encounter.  EXAM: LEFT ANKLE COMPLETE - 3+ VIEW  COMPARISON:  None.  FINDINGS: Oblique mildly displaced fracture of the distal fibula proximal to the ankle mortise. Mildly displaced medial malleolar fracture. Cortical irregularity of the posterior tibial tubercle consistent with nondisplaced fracture. The bones are under  mineralized. There is widening of the medial clear space. There is diffuse soft tissue edema about the ankle.  IMPRESSION: Mildly displaced trimalleolar fracture.   Electronically Signed   By: Rubye Oaks M.D.   On: 01/05/2015 21:01    ____________________________________________   PROCEDURES  Procedure(s) performed: None  Critical Care performed: No ____________________________________________   INITIAL IMPRESSION / ASSESSMENT AND PLAN / ED COURSE  Pertinent labs & imaging results that were available during my care of the patient were reviewed by me and considered in my medical decision making (see chart for details).  81 year old female with history of recurrent falls who presents today with left ankle pain, x-ray from triage shows a trimalleolar fracture. She has no evidence of other injury. Given the headache that she described after the fall, will get CT of the head. Her EKG is reassuring and does not show any evidence of arrhythmia or ischemia.  ----------------------------------------- 10:18 PM on 01/05/2015 -----------------------------------------  Dr. Joice Lofts was called for consultation. He placed the patient in a splint. We're waiting postreduction films. The patient will be nonweightbearing, so we will have to see if she is able to tolerate that with a wheelchair or a walker. If her CT scan is  negative and she is able to demonstrate nonweightbearing status, she will be discharged home for outpatient follow-up and initial nonoperative management. However, if there is any concern that the patient will not be safe with the nonweightbearing status, she will have to be admitted to the hospitalist service.  ----------------------------------------- 10:37 PM on 01/05/2015 -----------------------------------------  Dr. Joice Lofts has reviewed the postreduction film, at this time the ankle will need to be reevaluated tomorrow morning for possible surgical intervention. The patient  will be unable to be nonweightbearing at her home and risks fall with further injury. My plan is to admit the patient to the internist, with orthopedics continuing to follow for the ankle. The patient and her family are in agreement with this plan.  ____________________________________________  FINAL CLINICAL IMPRESSION(S) / ED DIAGNOSES  Final diagnoses:  Trimalleolar fracture of ankle, closed, left, initial encounter  Abrasion of left lower extremity, initial encounter  Fall, initial encounter  Nonintractable headache, unspecified chronicity pattern, unspecified headache type      NEW MEDICATIONS STARTED DURING THIS VISIT:  New Prescriptions   No medications on file     Rockne Menghini, MD 01/05/15 2221  Rockne Menghini, MD 01/05/15 2238

## 2015-01-06 ENCOUNTER — Encounter
Admission: EM | Disposition: A | Discharge status: Skilled Nursing Facility | Payer: Self-pay | Source: Home / Self Care | Attending: Internal Medicine

## 2015-01-06 ENCOUNTER — Inpatient Hospital Stay: Payer: Medicare Other

## 2015-01-06 ENCOUNTER — Encounter: Payer: Self-pay | Admitting: Anesthesiology

## 2015-01-06 ENCOUNTER — Inpatient Hospital Stay: Payer: Medicare Other | Admitting: Anesthesiology

## 2015-01-06 HISTORY — PX: ORIF ANKLE FRACTURE: SHX5408

## 2015-01-06 LAB — URINALYSIS COMPLETE WITH MICROSCOPIC (ARMC ONLY)
BACTERIA UA: NONE SEEN
Bilirubin Urine: NEGATIVE
GLUCOSE, UA: NEGATIVE mg/dL
Hgb urine dipstick: NEGATIVE
Ketones, ur: NEGATIVE mg/dL
Nitrite: NEGATIVE
PROTEIN: NEGATIVE mg/dL
SPECIFIC GRAVITY, URINE: 1.006 (ref 1.005–1.030)
pH: 6 (ref 5.0–8.0)

## 2015-01-06 LAB — GLUCOSE, CAPILLARY
GLUCOSE-CAPILLARY: 94 mg/dL (ref 65–99)
Glucose-Capillary: 109 mg/dL — ABNORMAL HIGH (ref 65–99)
Glucose-Capillary: 108 mg/dL — ABNORMAL HIGH (ref 65–99)

## 2015-01-06 LAB — TYPE AND SCREEN
ABO/RH(D): B POS
Antibody Screen: NEGATIVE

## 2015-01-06 LAB — SURGICAL PCR SCREEN
MRSA, PCR: NEGATIVE
Staphylococcus aureus: NEGATIVE

## 2015-01-06 SURGERY — OPEN REDUCTION INTERNAL FIXATION (ORIF) ANKLE FRACTURE
Anesthesia: Spinal | Site: Ankle | Laterality: Left | Wound class: Clean

## 2015-01-06 MED ORDER — ONDANSETRON HCL 4 MG/2ML IJ SOLN: 4.0000 mg | Freq: Four times a day (QID) | INTRAMUSCULAR | Status: DC | PRN

## 2015-01-06 MED ORDER — NEOMYCIN-POLYMYXIN B GU 40-200000 IR SOLN
Status: DC | PRN
  Administered 2015-01-06: 2 mL

## 2015-01-06 MED ORDER — MEMANTINE HCL 10 MG PO TABS
5.0000 mg | ORAL_TABLET | Freq: Every day | ORAL | Status: DC
  Administered 2015-01-07 – 2015-01-09 (×3): 5 mg via ORAL
  Filled 2015-01-06 (×3): qty 1

## 2015-01-06 MED ORDER — ACETAMINOPHEN 10 MG/ML IV SOLN
INTRAVENOUS | Status: AC
  Filled 2015-01-06: qty 100

## 2015-01-06 MED ORDER — ACETAMINOPHEN 650 MG RE SUPP: 650.0000 mg | Freq: Four times a day (QID) | RECTAL | Status: DC | PRN

## 2015-01-06 MED ORDER — MAGNESIUM HYDROXIDE 400 MG/5ML PO SUSP: 30.0000 mL | Freq: Every day | ORAL | Status: DC | PRN

## 2015-01-06 MED ORDER — ACETAMINOPHEN 325 MG PO TABS
650.0000 mg | ORAL_TABLET | Freq: Four times a day (QID) | ORAL | Status: DC | PRN
Start: 2015-01-06 — End: 2015-01-09
  Administered 2015-01-06 – 2015-01-08 (×2): 650 mg via ORAL
  Filled 2015-01-06 (×2): qty 2

## 2015-01-06 MED ORDER — METOCLOPRAMIDE HCL 5 MG/ML IJ SOLN: 5.0000 mg | Freq: Three times a day (TID) | INTRAMUSCULAR | Status: DC | PRN

## 2015-01-06 MED ORDER — CALCIUM CARBONATE-VITAMIN D 500-200 MG-UNIT PO TABS
2.0000 | ORAL_TABLET | Freq: Two times a day (BID) | ORAL | Status: DC
  Administered 2015-01-06 – 2015-01-09 (×6): 2 via ORAL
  Filled 2015-01-06 (×6): qty 2

## 2015-01-06 MED ORDER — EPHEDRINE SULFATE 50 MG/ML IJ SOLN
INTRAMUSCULAR | Status: DC | PRN
  Administered 2015-01-06: 10 mg via INTRAVENOUS

## 2015-01-06 MED ORDER — CEFAZOLIN SODIUM 1-5 GM-% IV SOLN
INTRAVENOUS | Status: DC | PRN
  Administered 2015-01-06: 1 g via INTRAVENOUS

## 2015-01-06 MED ORDER — SODIUM CHLORIDE 0.9 % IV SOLN
INTRAVENOUS | Status: AC
  Administered 2015-01-06: 03:00:00 via INTRAVENOUS

## 2015-01-06 MED ORDER — LOPERAMIDE HCL 2 MG PO CAPS: 2.0000 mg | ORAL_CAPSULE | Freq: Four times a day (QID) | ORAL | Status: DC | PRN

## 2015-01-06 MED ORDER — FENTANYL CITRATE (PF) 100 MCG/2ML IJ SOLN: 25.0000 ug | INTRAMUSCULAR | Status: DC | PRN

## 2015-01-06 MED ORDER — ONDANSETRON HCL 4 MG PO TABS: 4.0000 mg | ORAL_TABLET | Freq: Four times a day (QID) | ORAL | Status: DC | PRN

## 2015-01-06 MED ORDER — FENTANYL CITRATE (PF) 100 MCG/2ML IJ SOLN
INTRAMUSCULAR | Status: DC | PRN
  Administered 2015-01-06: 50 ug via INTRAVENOUS

## 2015-01-06 MED ORDER — DOCUSATE SODIUM 100 MG PO CAPS
100.0000 mg | ORAL_CAPSULE | Freq: Two times a day (BID) | ORAL | Status: DC
  Administered 2015-01-06 – 2015-01-09 (×6): 100 mg via ORAL
  Filled 2015-01-06 (×6): qty 1

## 2015-01-06 MED ORDER — LEVOTHYROXINE SODIUM 125 MCG PO TABS
125.0000 ug | ORAL_TABLET | Freq: Every day | ORAL | Status: DC
  Administered 2015-01-07 – 2015-01-09 (×3): 125 ug via ORAL
  Filled 2015-01-06 (×3): qty 1

## 2015-01-06 MED ORDER — DONEPEZIL HCL 5 MG PO TABS
10.0000 mg | ORAL_TABLET | Freq: Every day | ORAL | Status: DC
  Administered 2015-01-06 – 2015-01-08 (×3): 10 mg via ORAL
  Filled 2015-01-06 (×3): qty 2

## 2015-01-06 MED ORDER — METOCLOPRAMIDE HCL 5 MG PO TABS: 5.0000 mg | ORAL_TABLET | Freq: Three times a day (TID) | ORAL | Status: DC | PRN

## 2015-01-06 MED ORDER — ACETAMINOPHEN 10 MG/ML IV SOLN
INTRAVENOUS | Status: DC | PRN
  Administered 2015-01-06: 1000 mg via INTRAVENOUS

## 2015-01-06 MED ORDER — HYDROCODONE-ACETAMINOPHEN 5-325 MG PO TABS
1.0000 | ORAL_TABLET | ORAL | Status: DC | PRN
  Administered 2015-01-06: 1 via ORAL
  Filled 2015-01-06: qty 1

## 2015-01-06 MED ORDER — ADULT MULTIVITAMIN W/MINERALS CH
1.0000 | ORAL_TABLET | Freq: Every day | ORAL | Status: DC
  Administered 2015-01-07 – 2015-01-09 (×3): 1 via ORAL
  Filled 2015-01-06 (×4): qty 1

## 2015-01-06 MED ORDER — COENZYME Q10 30 MG PO CAPS: 100.0000 mg | ORAL_CAPSULE | Freq: Every day | ORAL | Status: DC

## 2015-01-06 MED ORDER — NEOMYCIN-POLYMYXIN B GU 40-200000 IR SOLN
Status: AC
  Filled 2015-01-06: qty 4

## 2015-01-06 MED ORDER — ASPIRIN 81 MG PO CHEW
81.0000 mg | CHEWABLE_TABLET | Freq: Every day | ORAL | Status: DC
Start: 2015-01-06 — End: 2015-01-09
  Administered 2015-01-07 – 2015-01-09 (×3): 81 mg via ORAL
  Filled 2015-01-06 (×3): qty 1

## 2015-01-06 MED ORDER — CEFAZOLIN SODIUM 1-5 GM-% IV SOLN
1.0000 g | Freq: Four times a day (QID) | INTRAVENOUS | Status: AC
  Administered 2015-01-06 – 2015-01-07 (×3): 1 g via INTRAVENOUS
  Filled 2015-01-06 (×4): qty 50

## 2015-01-06 MED ORDER — CITALOPRAM HYDROBROMIDE 20 MG PO TABS
20.0000 mg | ORAL_TABLET | Freq: Every day | ORAL | Status: DC
  Administered 2015-01-07 – 2015-01-09 (×3): 20 mg via ORAL
  Filled 2015-01-06 (×3): qty 1

## 2015-01-06 MED ORDER — INSULIN ASPART 100 UNIT/ML ~~LOC~~ SOLN
0.0000 [IU] | SUBCUTANEOUS | Status: DC
  Administered 2015-01-07 (×3): 1 [IU] via SUBCUTANEOUS
  Administered 2015-01-07 – 2015-01-08 (×2): 2 [IU] via SUBCUTANEOUS
  Filled 2015-01-06: qty 1
  Filled 2015-01-06: qty 2
  Filled 2015-01-06 (×2): qty 1
  Filled 2015-01-06: qty 2

## 2015-01-06 MED ORDER — MAGNESIUM OXIDE 400 (241.3 MG) MG PO TABS
400.0000 mg | ORAL_TABLET | Freq: Every day | ORAL | Status: DC
  Administered 2015-01-07 – 2015-01-09 (×3): 400 mg via ORAL
  Filled 2015-01-06 (×3): qty 1

## 2015-01-06 MED ORDER — PANTOPRAZOLE SODIUM 40 MG PO TBEC
40.0000 mg | DELAYED_RELEASE_TABLET | Freq: Every day | ORAL | Status: DC
  Administered 2015-01-07 – 2015-01-09 (×3): 40 mg via ORAL
  Filled 2015-01-06 (×3): qty 1

## 2015-01-06 MED ORDER — PROPOFOL INFUSION 10 MG/ML OPTIME
INTRAVENOUS | Status: DC | PRN
  Administered 2015-01-06: 60 ug/kg/min via INTRAVENOUS

## 2015-01-06 MED ORDER — ONDANSETRON HCL 4 MG/2ML IJ SOLN: 4.0000 mg | Freq: Once | INTRAMUSCULAR | Status: DC | PRN

## 2015-01-06 MED ORDER — MORPHINE SULFATE (PF) 2 MG/ML IV SOLN
1.0000 mg | INTRAVENOUS | Status: DC | PRN
  Administered 2015-01-06 – 2015-01-07 (×2): 1 mg via INTRAVENOUS
  Filled 2015-01-06 (×2): qty 1

## 2015-01-06 MED ORDER — MAGNESIUM CITRATE PO SOLN: 1.0000 | Freq: Once | ORAL | Status: DC | PRN

## 2015-01-06 MED ORDER — BISACODYL 10 MG RE SUPP: 10.0000 mg | Freq: Every day | RECTAL | Status: DC | PRN

## 2015-01-06 MED ORDER — CHLORHEXIDINE GLUCONATE CLOTH 2 % EX PADS
6.0000 | MEDICATED_PAD | Freq: Every day | CUTANEOUS | Status: DC
  Administered 2015-01-06 – 2015-01-08 (×3): 6 via TOPICAL

## 2015-01-06 MED ORDER — MAGNESIUM 30 MG PO TABS: 500.0000 mg | ORAL_TABLET | Freq: Every day | ORAL | Status: DC

## 2015-01-06 SURGICAL SUPPLY — 57 items
BANDAGE ELASTIC 4 CLIP ST LF (GAUZE/BANDAGES/DRESSINGS) ×6 IMPLANT
BIT DRILL 2.5X2.75 QC CALB (BIT) ×3 IMPLANT
BIT DRILL CALIBRATED 2.7 (BIT) ×2 IMPLANT
BIT DRILL CALIBRATED 2.7MM (BIT) ×1
BLADE SURG SZ10 CARB STEEL (BLADE) ×6 IMPLANT
BNDG ESMARK 4X12 TAN STRL LF (GAUZE/BANDAGES/DRESSINGS) ×3 IMPLANT
CANISTER SUCT 1200ML W/VALVE (MISCELLANEOUS) ×3 IMPLANT
CHLORAPREP W/TINT 26ML (MISCELLANEOUS) ×3 IMPLANT
DRAPE FLUOR MINI C-ARM 54X84 (DRAPES) ×3 IMPLANT
DRAPE INCISE IOBAN 66X45 STRL (DRAPES) ×3 IMPLANT
DRAPE U-SHAPE 47X51 STRL (DRAPES) ×3 IMPLANT
DRSG EMULSION OIL 3X8 NADH (GAUZE/BANDAGES/DRESSINGS) ×3 IMPLANT
ELECT CAUTERY BLADE 6.4 (BLADE) ×3 IMPLANT
GAUZE PETRO XEROFOAM 1X8 (MISCELLANEOUS) ×3 IMPLANT
GAUZE SPONGE 4X4 12PLY STRL (GAUZE/BANDAGES/DRESSINGS) ×3 IMPLANT
GLOVE BIOGEL PI IND STRL 9 (GLOVE) ×1 IMPLANT
GLOVE BIOGEL PI INDICATOR 9 (GLOVE) ×2
GLOVE INDICATOR 7.5 STRL GRN (GLOVE) ×3 IMPLANT
GLOVE SURG ORTHO 9.0 STRL STRW (GLOVE) ×3 IMPLANT
GOWN SPECIALTY ULTRA XL (MISCELLANEOUS) ×3 IMPLANT
GOWN STRL REUS W/ TWL LRG LVL3 (GOWN DISPOSABLE) ×1 IMPLANT
GOWN STRL REUS W/TWL LRG LVL3 (GOWN DISPOSABLE) ×2
HEMOVAC 400ML (MISCELLANEOUS)
K-WIRE ACE 1.6X6 (WIRE) ×3
KIT DRAIN HEMOVAC JP 7FR 400ML (MISCELLANEOUS) IMPLANT
KIT RM TURNOVER STRD PROC AR (KITS) ×3 IMPLANT
KWIRE ACE 1.6X6 (WIRE) ×1 IMPLANT
LABEL OR SOLS (LABEL) ×3 IMPLANT
NS IRRIG 1000ML POUR BTL (IV SOLUTION) IMPLANT
NS IRRIG 500ML POUR BTL (IV SOLUTION) ×3 IMPLANT
PACK EXTREMITY ARMC (MISCELLANEOUS) ×3 IMPLANT
PAD ABD DERMACEA PRESS 5X9 (GAUZE/BANDAGES/DRESSINGS) ×6 IMPLANT
PAD CAST CTTN 4X4 STRL (SOFTGOODS) ×2 IMPLANT
PAD GROUND ADULT SPLIT (MISCELLANEOUS) ×3 IMPLANT
PAD PREP 24X41 OB/GYN DISP (PERSONAL CARE ITEMS) ×3 IMPLANT
PADDING CAST COTTON 4X4 STRL (SOFTGOODS) ×4
PLATE FIBULAR COMP LOCK 10H (Plate) ×3 IMPLANT
SCREW ACE CAN 4.0 36M (Screw) ×3 IMPLANT
SCREW CORT 3.5X40 (Screw) ×3 IMPLANT
SCREW LOCK CORT STAR 3.5X10 (Screw) ×3 IMPLANT
SCREW LOCK CORT STAR 3.5X12 (Screw) ×6 IMPLANT
SCREW LOW PROFILE 12MMX3.5MM (Screw) ×3 IMPLANT
SCREW NON LOCKING LP 3.5 14MM (Screw) ×6 IMPLANT
SCREW NON LOCKING LP 3.5 16MM (Screw) ×3 IMPLANT
SPLINT CAST 1 STEP 5X30 WHT (MISCELLANEOUS) ×3 IMPLANT
SPONGE LAP 18X18 5 PK (GAUZE/BANDAGES/DRESSINGS) ×3 IMPLANT
STAPLER SKIN PROX 35W (STAPLE) ×3 IMPLANT
STOCKINETTE STRL 6IN 960660 (GAUZE/BANDAGES/DRESSINGS) ×3 IMPLANT
SUT ETHILON 3-0 FS-10 30 BLK (SUTURE) ×3
SUT MNCRL AB 4-0 PS2 18 (SUTURE) ×6 IMPLANT
SUT VIC AB 0 CT1 36 (SUTURE) ×3 IMPLANT
SUT VIC AB 2-0 SH 27 (SUTURE) ×4
SUT VIC AB 2-0 SH 27XBRD (SUTURE) ×2 IMPLANT
SUT VIC AB 3-0 SH 27 (SUTURE) ×2
SUT VIC AB 3-0 SH 27X BRD (SUTURE) ×1 IMPLANT
SUTURE EHLN 3-0 FS-10 30 BLK (SUTURE) ×1 IMPLANT
SYRINGE 10CC LL (SYRINGE) ×3 IMPLANT

## 2015-01-06 NOTE — Progress Notes (Signed)
Pt. Alert but forgetful. VSS. Pain controlled with PO pain meds. Frequency with urine. NPO. Surgery will be discussed with family and surgeon today. Soft cast to left ankle. Resting quietly.

## 2015-01-06 NOTE — Progress Notes (Signed)
Daughter very upset about pt having surgery stating the ER Doctor said pt might not need surgery . Marland Kitchen Explained to daughter Dr Rosita Kea had examined pt and reviewed xrays and decided pt needed surgery . Nurse paged Dr Rosita Kea who returned call and spoke with pt daughter concerning pt surgery . Pt verbalized no complaints of pain at present

## 2015-01-06 NOTE — Anesthesia Procedure Notes (Signed)
Spinal Patient location during procedure: OR Start time: 01/06/2015 1:27 PM Staffing Anesthesiologist: Naomie Dean Resident/CRNA: Chong Sicilian Performed by: resident/CRNA and anesthesiologist  Preanesthetic Checklist Completed: patient identified, site marked, surgical consent, pre-op evaluation, timeout performed, IV checked, risks and benefits discussed and monitors and equipment checked Spinal Block Patient position: sitting Prep: Betadine Patient monitoring: heart rate, cardiac monitor, continuous pulse ox and blood pressure Approach: midline Location: L4-5 Injection technique: single-shot Needle Needle type: Whitacre  Needle gauge: 25 G Assessment Sensory level: T8 Additional Notes Clear csf no paresthesia

## 2015-01-06 NOTE — Progress Notes (Signed)
[facility] - [place] at [facility]                                                                                                                                                                                            Patient Demographics   Sara Delgado, is a 81 y.o. female, DOB - August 12, 1926, WUJ:[id number]  Admit date - 01/05/2015   Admitting Physician Crissie Figures, MD  Outpatient Primary MD for the patient is No primary care provider on file.   LOS - 1  Subjective: Patient admitted with a fall and noted to have ankle fracture, currently denies any symptoms no chest pain or shortness of breath has pain in the foot     Review of Systems:   CONSTITUTIONAL: No documented fever. No fatigue, weakness. No weight gain, no weight loss.  EYES: No blurry or double vision.  ENT: No tinnitus. No postnasal drip. No redness of the oropharynx.  RESPIRATORY: No cough, no wheeze, no hemoptysis. No dyspnea.  CARDIOVASCULAR: No chest pain. No orthopnea. No palpitations. No syncope.  GASTROINTESTINAL: No nausea, no vomiting or diarrhea. No abdominal pain. No melena or hematochezia.  GENITOURINARY: No dysuria or hematuria.  ENDOCRINE: No polyuria or nocturia. No heat or cold intolerance.  HEMATOLOGY: No anemia. No bruising. No bleeding.  INTEGUMENTARY: No rashes. No lesions.  MUSCULOSKELETAL: Ankle pain  NEUROLOGIC: No numbness, tingling, or ataxia. No seizure-type activity.  PSYCHIATRIC: No anxiety. No insomnia. No ADD.    Vitals:   Filed Vitals:   01/06/15 0100 01/06/15 0130 01/06/15 0155 01/06/15 0831  BP: 147/59 144/50 145/47 136/51  Pulse: 62 59 64 58  Temp:   98.1 F (36.7 C) 98.4 F (36.9 C)  TempSrc:   Oral Oral  Resp:   18 18  Height:      Weight:      SpO2: 100% 99% 100% 99%    Wt Readings from Last 3 Encounters:  01/05/15 61.236 kg (135 lb)     Intake/Output Summary (Last 24 hours) at 01/06/15 1244 Last data filed at 01/06/15 1133  Gross  per 24 hour  Intake    208 ml  Output   1200 ml  Net   -992 ml    Physical Exam:   GENERAL: Pleasant-appearing in no apparent distress.  HEAD, EYES, EARS, NOSE AND THROAT: Atraumatic, normocephalic. Extraocular muscles are intact. Pupils equal and reactive to light. Sclerae anicteric. No conjunctival injection. No oro-pharyngeal erythema.  NECK: Supple. There is no jugular venous distention. No bruits, no lymphadenopathy, no thyromegaly.  HEART: Regular rate and rhythm,. No murmurs, no rubs, no clicks.  LUNGS: Clear to auscultation bilaterally. No rales or rhonchi. No wheezes.  ABDOMEN: Soft, flat, nontender, nondistended. Has good bowel sounds. No hepatosplenomegaly appreciated.  EXTREMITIES: No evidence of any cyanosis, clubbing, or peripheral edema.  +2 pedal and radial pulses bilaterally.  NEUROLOGIC: The patient is alert, awake, and oriented x3 with no focal motor or sensory deficits appreciated bilaterally.  SKIN: Moist and warm with no rashes appreciated.  Psych: Not anxious, depressed LN: No inguinal LN enlargement    Antibiotics   Anti-infectives    None      Medications   Scheduled Meds: . aspirin  81 mg Oral Daily  . calcium-vitamin D  2 tablet Oral BID  . Chlorhexidine Gluconate Cloth  6 each Topical Q0600  . citalopram  20 mg Oral Daily  . donepezil  10 mg Oral QHS  . insulin aspart  0-9 Units Subcutaneous 6 times per day  . [START ON 01/07/2015] levothyroxine  125 mcg Oral QAC breakfast  . magnesium oxide  400 mg Oral Daily  . memantine  5 mg Oral Daily  . multivitamin with minerals  1 tablet Oral Daily  . pantoprazole  40 mg Oral Daily   Continuous Infusions: . sodium chloride 60 mL/hr at 01/06/15 0232   PRN Meds:.acetaminophen **OR** acetaminophen, loperamide   Data Review:   Micro Results Recent Results (from the past 240 hour(s))  Surgical pcr screen     Status: None   Collection Time: 01/06/15  2:50 AM  Result Value Ref Range Status   MRSA,  PCR NEGATIVE NEGATIVE Final   Staphylococcus aureus NEGATIVE NEGATIVE Final    Comment:        The Xpert SA Assay (FDA approved for NASAL specimens in patients over 70 years of age), is one component of a comprehensive surveillance program.  Test performance has been validated by [facility] for patients greater than or equal to 58 year old. It is not intended to diagnose infection nor to guide or monitor treatment.     Radiology Reports Dg Ankle Complete Left  01/05/2015   CLINICAL DATA:  Post reduction.  EXAM: LEFT ANKLE COMPLETE - 3+ VIEW  COMPARISON:  Earlier today.  FINDINGS: Overlying cast obscures evaluation of bony detail. Minimally displaced medial malleolar fracture unchanged. Min displaced oblique fracture of the distal fibular diametaphyseal region without significant change. For a nondisplaced posterior malleolar fracture without significant change. Slight widening of the medial aspect of the ankle mortise unchanged.  IMPRESSION: Minimally displaced trimalleolar fracture without significant change post reduction and casting. Slight widening of the medial aspect of the ankle mortise unchanged.   Electronically Signed   By: Elberta Fortis M.D.   On: 01/05/2015 23:24   Dg Ankle Complete Left  01/05/2015   CLINICAL DATA:  Left ankle pain after fall today. Fall in the shower, now with swelling and bruising. Initial encounter.  EXAM: LEFT ANKLE COMPLETE - 3+ VIEW  COMPARISON:  None.  FINDINGS: Oblique mildly displaced fracture of the distal fibula proximal to the ankle mortise. Mildly displaced medial malleolar fracture. Cortical irregularity of the posterior tibial tubercle consistent with nondisplaced fracture. The bones are under mineralized. There is widening of the medial clear space. There is diffuse soft tissue edema about the ankle.  IMPRESSION: Mildly displaced trimalleolar fracture.   Electronically Signed   By: Rubye Oaks M.D.   On: 01/05/2015 21:01   Ct Head Wo  Contrast  01/05/2015   CLINICAL DATA:  81 year old female post fall. No loss of  consciousness.  EXAM: CT HEAD WITHOUT CONTRAST  TECHNIQUE: Contiguous axial images were obtained from the base of the skull through the vertex without intravenous contrast.  COMPARISON:  04/24/2014  FINDINGS: Age-related atrophy, unchanged. No intracranial hemorrhage, mass effect, or midline shift. No hydrocephalus. The basilar cisterns are patent. No evidence of territorial infarct. No intracranial fluid collection. Calvarium is intact. Included paranasal sinuses and mastoid air cells are well aerated.  IMPRESSION: No acute intracranial abnormality.   Electronically Signed   By: Rubye Oaks M.D.   On: 01/05/2015 23:16     CBC  Recent Labs Lab 01/05/15 2042  WBC 8.8  HGB 11.6*  HCT 35.1  PLT 205  MCV 91.4  MCH 30.2  MCHC 33.0  RDW 13.5    Chemistries   Recent Labs Lab 01/05/15 2042  NA 136  K 4.3  CL 103  CO2 26  GLUCOSE 175*  BUN 29*  CREATININE 1.31*  CALCIUM 9.0   ------------------------------------------------------------------------------------------------------------------ estimated creatinine clearance is 28.9 mL/min (by C-G formula based on Cr of 1.31). ------------------------------------------------------------------------------------------------------------------ No results for input(s): HGBA1C in the last 72 hours. ------------------------------------------------------------------------------------------------------------------ No results for input(s): CHOL, HDL, LDLCALC, TRIG, CHOLHDL, LDLDIRECT in the last 72 hours. ------------------------------------------------------------------------------------------------------------------ No results for input(s): TSH, T4TOTAL, T3FREE, THYROIDAB in the last 72 hours.  Invalid input(s): FREET3 ------------------------------------------------------------------------------------------------------------------ No results for input(s):  VITAMINB12, FOLATE, FERRITIN, TIBC, IRON, RETICCTPCT in the last 72 hours.  Coagulation profile  Recent Labs Lab 01/05/15 2042  INR 0.98    No results for input(s): DDIMER in the last 72 hours.  Cardiac Enzymes No results for input(s): CKMB, TROPONINI, MYOGLOBIN in the last 168 hours.  Invalid input(s): CK ------------------------------------------------------------------------------------------------------------------ Invalid input(s): POCBNP    Assessment & Plan     Trimalleolar fracture of left ankle plan for or later today   Dementia continue Aricept as taking at home and Namenda   HTN (hypertension) blood pressure medications on hold due to anticipated surgery today   DM (diabetes mellitus) sliding scale insulin   HLD (hyperlipidemia) currently not on any treatment at home   Hypothyroidism continue Synthroid      Code Status Orders        Start     Ordered   01/06/15 0150  Full code   Continuous     01/06/15 0149    Advance Directive Documentation        Most Recent Value   Type of Advance Directive  Living will   Pre-existing out of facility DNR order (yellow form or pink MOST form)     "MOST" Form in Place?             Consults  orthopedics   DVT Prophylaxis  SCDs  Lab Results  Component Value Date   PLT 205 01/05/2015     Time Spent in minutes   45 minutes    Auburn Bilberry M.D on 01/06/2015 at 12:44 PM  Between 7am to 6pm - Pager - [phone]  After 6pm go to [email or web address] - password [email or web address] [facility]  [facility] [place] [facility]   Office  [phone]

## 2015-01-06 NOTE — Anesthesia Preprocedure Evaluation (Addendum)
Anesthesia Evaluation  Patient identified by MRN, date of birth, ID band Patient awake    Reviewed: Allergy & Precautions, NPO status , Patient's Chart, lab work & pertinent test results  Airway Mallampati: II  TM Distance: >3 FB     Dental  (+) Chipped   Pulmonary asthma (remote hx) ,           Cardiovascular hypertension, Pt. on medications      Neuro/Psych PSYCHIATRIC DISORDERS    GI/Hepatic   Endo/Other  diabetes (hx, now diet controlled), Type 2Hypothyroidism   Renal/GU      Musculoskeletal   Abdominal   Peds  Hematology   Anesthesia Other Findings   Reproductive/Obstetrics                           Anesthesia Physical Anesthesia Plan  ASA: III  Anesthesia Plan: Spinal   Post-op Pain Management:    Induction: Intravenous  Airway Management Planned: Nasal Cannula  Additional Equipment:   Intra-op Plan:   Post-operative Plan:   Informed Consent: I have reviewed the patients History and Physical, chart, labs and discussed the procedure including the risks, benefits and alternatives for the proposed anesthesia with the patient or authorized representative who has indicated his/her understanding and acceptance.     Plan Discussed with: CRNA  Anesthesia Plan Comments:        Anesthesia Quick Evaluation

## 2015-01-06 NOTE — Transfer of Care (Signed)
Immediate Anesthesia Transfer of Care Note   Patient: Sara Delgado  Procedure(s) Performed: Procedure(s): OPEN REDUCTION INTERNAL FIXATION (ORIF) ANKLE FRACTURE (Left)  Patient Location: PACU  Anesthesia Type:Regional  Level of Consciousness: sedated  Airway & Oxygen Therapy: Patient Spontanous Breathing and Patient connected to face mask oxygen  Post-op Assessment: Report given to RN and Post -op Vital signs reviewed and stable  Post vital signs: Reviewed and stable  Last Vitals: 98.1 100% 56 hr 14resp 126/44  Filed Vitals:   01/06/15 0831  BP: 136/51  Pulse: 58  Temp: 36.9 C  Resp: 18    Complications: No apparent anesthesia complications

## 2015-01-06 NOTE — Care Management Note (Signed)
Case Management Note  Patient Details  Name: Sara Delgado MRN: [id number] Date of Birth: [birth year]  Subjective/Objective:                  Surgery today. Met with patient and her husband to discuss discharge planning. They live together in [place] in a house. She would like to go to [facility] for rehab. She typically uses a walker when she gets tired at home but "hasn't needed one over the last few weeks". She uses [facility] for Rx on [address]. Her PCP is with [facility].   Action/Plan: List of home health agencies left with patient/husband. RNCM will continue to follow.   Expected Discharge Date:  01/10/15               Expected Discharge Plan:     In-House Referral:  Clinical Social Work  Discharge planning Services  CM Consult  Post Acute Care Choice:  Durable Medical Equipment, Home Health Choice offered to:  Patient, Spouse  DME Arranged:    DME Agency:     HH Arranged:    [place] Agency:     Status of Service:  In process, will continue to follow  Medicare Important Message Given:    Date Medicare IM Given:    Medicare IM give by:    Date Additional Medicare IM Given:    Additional Medicare Important Message give by:     If discussed at [place] of Stay Meetings, dates discussed:    Additional Comments:  Marshell Garfinkel, RN 01/06/2015, 11:35 AM

## 2015-01-06 NOTE — Op Note (Signed)
01/05/2015 - 01/06/2015  3:23 PM  PATIENT:  Sara Delgado  81 y.o. female  PRE-OPERATIVE DIAGNOSIS:  ankle fracture triimalleolar  POST-OPERATIVE DIAGNOSIS:  left ankle fracture, trimalleolar  PROCEDURE:  Procedure(s): OPEN REDUCTION INTERNAL FIXATION (ORIF) ANKLE FRACTURE (Left) Medial and lateral malleoli  SURGEON: Leitha Schuller, MD  ASSISTANTS: None  ANESTHESIA:   spinal  EBL:  Total I/O In: 400 [I.V.:400] Out: 750 [Urine:750]  BLOOD ADMINISTERED:none  DRAINS: none   LOCAL MEDICATIONS USED:  NONE  SPECIMEN:  No Specimen  DISPOSITION OF SPECIMEN:  N/A  COUNTS:  YES  TOURNIQUET:   55 minutes at 3 mmHg   IMPLANT  Biomet composite locking distal fibular plate and multiple screws and 4.0 cannulated screw DICTATION: .Dragon Dictation patient was brought the operating room and after adequate anesthesia was obtained, the left leg was prepped and draped in sterile fashion with a bump underneath left buttock and a tourniquet by the upper thigh. After patient identification and timeout procedures were completed tourniquet was raised to 300 mmHg. Incision was made over the distal fibula and a precontoured plate was slid up under the skin over the distal fibula to correct the deformity of the fracture site proximal fixation was obtained through percutaneous screw fixation making small incision drilling and placing cortical screws 3 proximal screws were placed. That following this 3 distal locking screws were placed through the distal most distal portion of the plate. In between these a syndesmotic screw was placed giving anatomic reduction of the ankle. A small incision was made medially and a guidewire inserted through the medial malleolus followed by 36  Mm 4.0 cannulated screw this gave additional stability to the fracture and to varus valgus there is no loss of fixation or alignment. The wounds were irrigated and closed with 2-0 Vicryl subcutaneous and skin staples Xeroform 4 x 4's  web roll 8 splints and Ace wrap were then applied and tourniquet let down at the close of the case   PLAN OF CARE: Continue inpatient  PATIENT DISPOSITION:  PACU - hemodynamically stable.

## 2015-01-06 NOTE — Progress Notes (Signed)

## 2015-01-07 LAB — CBC
HEMATOCRIT: 29.9 % — AB (ref 35.0–47.0)
Hemoglobin: 10.3 g/dL — ABNORMAL LOW (ref 12.0–16.0)
MCH: 31.3 pg (ref 26.0–34.0)
MCHC: 34.6 g/dL (ref 32.0–36.0)
MCV: 90.4 fL (ref 80.0–100.0)
PLATELETS: 148 10*3/uL — AB (ref 150–440)
RBC: 3.31 MIL/uL — ABNORMAL LOW (ref 3.80–5.20)
RDW: 13.3 % (ref 11.5–14.5)
WBC: 8.2 10*3/uL (ref 3.6–11.0)

## 2015-01-07 LAB — BASIC METABOLIC PANEL
Anion gap: 5 (ref 5–15)
BUN: 20 mg/dL (ref 6–20)
CALCIUM: 8.5 mg/dL — AB (ref 8.9–10.3)
CO2: 28 mmol/L (ref 22–32)
CREATININE: 0.87 mg/dL (ref 0.44–1.00)
Chloride: 105 mmol/L (ref 101–111)
GFR, EST NON AFRICAN AMERICAN: 59 mL/min — AB (ref >60)
Glucose, Bld: 137 mg/dL — ABNORMAL HIGH (ref 65–99)
Potassium: 4 mmol/L (ref 3.5–5.1)
Sodium: 138 mmol/L (ref 135–145)

## 2015-01-07 LAB — GLUCOSE, CAPILLARY
GLUCOSE-CAPILLARY: 122 mg/dL — AB (ref 65–99)
GLUCOSE-CAPILLARY: 177 mg/dL — AB (ref 65–99)
GLUCOSE-CAPILLARY: 150 mg/dL — AB (ref 65–99)
Glucose-Capillary: 117 mg/dL — ABNORMAL HIGH (ref 65–99)
Glucose-Capillary: 121 mg/dL — ABNORMAL HIGH (ref 65–99)
Glucose-Capillary: 124 mg/dL — ABNORMAL HIGH (ref 65–99)

## 2015-01-07 NOTE — Evaluation (Signed)
Occupational Therapy Evaluation Patient Details Name: Sara Delgado MRN: [id number] DOB: May 08, 1926 Today's Date: 01/07/2015    History of Present Illness Patient reports she was going to the bathroom and fell at home.  She was getting ready to go out for her birthday, still went to dinner but family convinced her to go to the ER afterwards to get her leg checked out.     Clinical Impression   Patient is a 81 yo female who fell at home in the bathroom and was brought to the ER by family.  She was diagnosed with a left ankle fracture and underwent surgery for repair.  She lives at home with her husband, in a one story one with 4 steps to enter and handrails on both sides.  She was previously ambulating without an assistive device and was independent with basic self care tasks.  She shared the housework with her husband, she typically cleaned and he cooked.  She does not drive any longer.  She now requires increased assistance with self care tasks and functional mobility skills and would benefit from skilled OT to maximize her safety and independence to return home with her husband. She will likely benefit from short term rehab prior to returning home.      Follow Up Recommendations  SNF    Equipment Recommendations       Recommendations for Other Services       Precautions / Restrictions Precautions Precautions: Fall Restrictions Weight Bearing Restrictions: Yes LLE Weight Bearing: Touchdown weight bearing Other Position/Activity Restrictions: TTWB      Mobility Bed Mobility                  Transfers Overall transfer level: Needs assistance Equipment used: Rolling walker (2 wheeled) Transfers: Sit to/from Stand Sit to Stand: Mod assist              Balance                                            ADL Overall ADL's : Needs assistance/impaired Eating/Feeding: Set up   Grooming: Set up   Upper Body Bathing: Set up;Minimal assitance    Lower Body Bathing: Maximal assistance   Upper Body Dressing : Set up;Minimal assistance   Lower Body Dressing: Set up;Maximal assistance   Toilet Transfer: Moderate assistance   Toileting- Clothing Manipulation and Hygiene: Moderate assistance               Vision     Perception     Praxis      Pertinent Vitals/Pain Pain Assessment: 0-10 Pain Score: 6  Pain Location: left leg Pain Descriptors / Indicators: Aching Pain Intervention(s): Limited activity within patient's tolerance     Hand Dominance Right   Extremity/Trunk Assessment Upper Extremity Assessment Upper Extremity Assessment: Generalized weakness   Lower Extremity Assessment Lower Extremity Assessment: Defer to PT evaluation       Communication Communication Communication: No difficulties   Cognition Arousal/Alertness: Awake/alert Behavior During Therapy: WFL for tasks assessed/performed Overall Cognitive Status: Within Functional Limits for tasks assessed                     General Comments       Exercises       Shoulder Instructions      Home Living Family/patient expects to be discharged to:: Private  residence (Wants short term rehab prior to going home) Living Arrangements: Spouse/significant other Available Help at Discharge: Family Type of Home: House Home Access: Stairs to enter Secretary/administrator of Steps: 4 Entrance Stairs-Rails: Can reach both Home Layout: One level     Bathroom Shower/Tub: Walk-in shower;Door   Bathroom Toilet: Standard Bathroom Accessibility: Yes              Prior Functioning/Environment Level of Independence: Independent        Comments: previously had home health but was doing well and completing visits.    OT Diagnosis: Generalized weakness;Other (comment) (decreased ability to perform self care)   OT Problem List: Decreased strength;Pain;Decreased activity tolerance;Decreased knowledge of precautions   OT  Treatment/Interventions: Self-care/ADL training;Therapeutic exercise;DME and/or AE instruction;Patient/family education    OT Goals(Current goals can be found in the care plan section) Acute Rehab OT Goals Patient Stated Goal: Patient reports she wants to eventually return home with husband and do as much as she can for herself and get back to normal. OT Goal Formulation: With patient/family Time For Goal Achievement: 01/14/15 Potential to Achieve Goals: Good  OT Frequency: Min 1X/week   Barriers to D/C:            Co-evaluation              End of Session Equipment Utilized During Treatment: Gait belt  Activity Tolerance: Patient tolerated treatment well;Patient limited by pain Patient left: in chair   Time: 1025-1047 OT Time Calculation (min): 22 min Charges:  OT General Charges $OT Visit: 1 Procedure OT Evaluation $Initial OT Evaluation Tier I: 1 Procedure G-Codes:    Lovett,Amy 2015-01-17, 11:00 AM

## 2015-01-07 NOTE — Evaluation (Signed)
Physical Therapy Evaluation Patient Details Name: Sara Delgado MRN: [id number] DOB: 1926/06/09 Today's Date: 01/07/2015   History of Present Illness  Patient reports she was going to the bathroom and fell at home.  She was getting ready to go out for her birthday, still went to dinner but family convinced her to go to the ER afterwards to get her leg checked out.    Clinical Impression  Pt apparently was quite active prior to this fall and showed good effort with bed mobility acts, but struggled considerably with getting to standing, maintaining standing and trying to ambulate.  She is pleasant t/o session and is able to keep weight off the L LE, but is unable to take an appropriate step.    Follow Up Recommendations SNF    Equipment Recommendations       Recommendations for Other Services       Precautions / Restrictions Precautions Precautions: Fall Restrictions Weight Bearing Restrictions: Yes LLE Weight Bearing: Touchdown weight bearing Other Position/Activity Restrictions: TTWB      Mobility  Bed Mobility Overal bed mobility: Needs Assistance Bed Mobility: Supine to Sit     Supine to sit: Mod assist     General bed mobility comments: pt shows good effort getting LEs to EOB, needs assist to lift trunk and scoot to EOB  Transfers Overall transfer level: Needs assistance Equipment used: Rolling walker (2 wheeled) Transfers: Sit to/from Stand Sit to Stand: Mod assist         General transfer comment: Pt struggles to get her R foot undreneath her and really stand upright (bottom well behind BOS despite much assist and cuing) and she did not show   Ambulation/Gait Ambulation/Gait assistance: Max assist;Total assist Ambulation Distance (Feet): 2 Feet Assistive device: Rolling walker (2 wheeled)       General Gait Details: pt unable to actually ambulate, she struggled to simply remain upright with walker and R WBing and though she did well to keep L LE TTWBing  she was completely unable to shift/hop/etc the R LE  Stairs            Wheelchair Mobility    Modified Rankin (Stroke Patients Only)       Balance                                             Pertinent Vitals/Pain Pain Assessment: 0-10 Pain Score: 3  Pain Location: left leg Pain Descriptors / Indicators: Aching Pain Intervention(s): Limited activity within patient's tolerance    Home Living Family/patient expects to be discharged to:: Skilled nursing facility Living Arrangements: Spouse/significant other Available Help at Discharge: Family Type of Home: House Home Access: Stairs to enter Entrance Stairs-Rails: Can reach both Entrance Stairs-Number of Steps: 4 Home Layout: One level        Prior Function Level of Independence: Independent         Comments: Pt reports she was able to be very active and did not have limitations in the home or the community     Hand Dominance   Dominant Hand: Right    Extremity/Trunk Assessment   Upper Extremity Assessment: Generalized weakness           Lower Extremity Assessment: Generalized weakness (grossly 3+/5 on L (and able to do SLR), R grossly 4/5)         Communication  Communication: No difficulties  Cognition Arousal/Alertness: Awake/alert Behavior During Therapy: WFL for tasks assessed/performed Overall Cognitive Status: Within Functional Limits for tasks assessed                      General Comments      Exercises General Exercises - Lower Extremity Ankle Circles/Pumps: AROM;10 reps (toe flx/ext on L only) Quad Sets: 5 reps;AROM Hip ABduction/ADduction: AROM;10 reps Straight Leg Raises: AROM;5 reps      Assessment/Plan    PT Assessment Patient needs continued PT services  PT Diagnosis Difficulty walking;Generalized weakness;Acute pain   PT Problem List Decreased strength;Decreased range of motion;Decreased activity tolerance;Decreased balance;Decreased  cognition;Decreased mobility;Decreased coordination;Decreased knowledge of use of DME;Decreased safety awareness  PT Treatment Interventions Gait training;DME instruction;Functional mobility training;Balance training;Therapeutic exercise;Therapeutic activities;Stair training   PT Goals (Current goals can be found in the Care Plan section) Acute Rehab PT Goals Patient Stated Goal: Patient reports she wants to eventually return home with husband and do as much as she can for herself and get back to normal. PT Goal Formulation: With patient Time For Goal Achievement: 01/21/15 Potential to Achieve Goals: Fair    Frequency BID   Barriers to discharge        Co-evaluation               End of Session Equipment Utilized During Treatment: Gait belt Activity Tolerance: Patient tolerated treatment well (does not tolerate standing/upright acts well) Patient left: with chair alarm set           Time: 1610-9604 PT Time Calculation (min) (ACUTE ONLY): 24 min   Charges:   PT Evaluation $Initial PT Evaluation Tier I: 1 Procedure PT Treatments $Therapeutic Exercise: 8-22 mins   PT G Codes:       Loran Senters, PT, DPT [phone]  Malachi Pro 01/07/2015, 1:03 PM

## 2015-01-07 NOTE — Progress Notes (Addendum)
[facility] - [place] at [facility]   PATIENT NAME: Sara Delgado    MR#:  [id number]  DATE OF BIRTH:  Jun 27, 1926  SUBJECTIVE:   No complaints REVIEW OF SYSTEMS:   Review of Systems  Constitutional: Negative for fever, chills and weight loss.  HENT: Negative for ear discharge, ear pain and nosebleeds.   Eyes: Negative for blurred vision, pain and discharge.  Respiratory: Negative for sputum production, shortness of breath, wheezing and stridor.   Cardiovascular: Negative for chest pain, palpitations, orthopnea and PND.  Gastrointestinal: Negative for nausea, vomiting, abdominal pain and diarrhea.  Genitourinary: Negative for urgency and frequency.  Musculoskeletal: Positive for joint pain. Negative for back pain.  Neurological: Negative for sensory change, speech change, focal weakness and weakness.  Psychiatric/Behavioral: Negative for depression. The patient is not nervous/anxious.   All other systems reviewed and are negative.  Tolerating Diet:yes Tolerating PT: yes  DRUG ALLERGIES:   Allergies  Allergen Reactions  . Atorvastatin Swelling    Swelling at higher doses  . Metformin And Related     VITALS:  Blood pressure 136/42, pulse 62, temperature 99.1 F (37.3 C), temperature source Oral, resp. rate 16, height  (1.676 m), weight 64.229 kg (141 lb 9.6 oz), SpO2 91 %.  PHYSICAL EXAMINATION:   Physical Exam  GENERAL:  81 y.o.-year-old patient lying in the bed with no acute distress.  EYES: Pupils equal, round, reactive to light and accommodation. No scleral icterus. Extraocular muscles intact.  HEENT: Head atraumatic, normocephalic. Oropharynx and nasopharynx clear.  NECK:  Supple, no jugular venous distention. No thyroid enlargement, no tenderness.  LUNGS: Normal breath sounds bilaterally, no wheezing, rales, rhonchi. No use of accessory muscles of respiration.  CARDIOVASCULAR: S1, S2 normal. No murmurs, rubs, or gallops.  ABDOMEN: Soft,  nontender, nondistended. Bowel sounds present. No organomegaly or mass.  EXTREMITIES: No cyanosis, clubbing or edema b/l.    NEUROLOGIC: Cranial nerves II through XII are intact. No focal Motor or sensory deficits b/l.   PSYCHIATRIC: The patient is alert and oriented x 3.  SKIN: No obvious rash, lesion, or ulcer.    LABORATORY PANEL:   CBC  Recent Labs Lab 01/07/15 0345  WBC 8.2  HGB 10.3*  HCT 29.9*  PLT 148*    Chemistries   Recent Labs Lab 01/07/15 0345  NA 138  K 4.0  CL 105  CO2 28  GLUCOSE 137*  BUN 20  CREATININE 0.87  CALCIUM 8.5*    Cardiac Enzymes No results for input(s): TROPONINI in the last 168 hours.  RADIOLOGY:  Dg Ankle 2 Views Left  01/06/2015   CLINICAL DATA:  Intraoperative imaging during the ORIF of left ankle fractures. Postoperative AP and lateral views of the left ankle.  EXAM: DG C-ARM 61-120 MIN-NO REPORT; LEFT ANKLE - 2 VIEW  COMPARISON:  01/05/2015  FINDINGS: Single portable operative image shows placement of a lateral fixation plate and multiple screws along the distal fibula a single screw inserted from the inferior medial malleolus into the distal tibial metaphysis. Fractures appear well aligned. Ankle mortise is well aligned.  Postoperative images of the left ankle, with the ankle supported in a fiberglass cast, showed the orthopedic hardware reducing the distal fibular and medial malleolar fractures into near anatomic alignment. The ankle mortise is normally spaced and aligned. The orthopedic hardware is well-seated.  IMPRESSION: Well-aligned left ankle fracture fragments following ORIF.   Electronically Signed   By: Amie Portland M.D.   On: 01/06/2015 15:38  Dg Ankle Complete Left  01/05/2015   CLINICAL DATA:  Post reduction.  EXAM: LEFT ANKLE COMPLETE - 3+ VIEW  COMPARISON:  Earlier today.  FINDINGS: Overlying cast obscures evaluation of bony detail. Minimally displaced medial malleolar fracture unchanged. Min displaced oblique fracture of  the distal fibular diametaphyseal region without significant change. For a nondisplaced posterior malleolar fracture without significant change. Slight widening of the medial aspect of the ankle mortise unchanged.  IMPRESSION: Minimally displaced trimalleolar fracture without significant change post reduction and casting. Slight widening of the medial aspect of the ankle mortise unchanged.   Electronically Signed   By: Elberta Fortis M.D.   On: 01/05/2015 23:24   Dg Ankle Complete Left  01/05/2015   CLINICAL DATA:  Left ankle pain after fall today. Fall in the shower, now with swelling and bruising. Initial encounter.  EXAM: LEFT ANKLE COMPLETE - 3+ VIEW  COMPARISON:  None.  FINDINGS: Oblique mildly displaced fracture of the distal fibula proximal to the ankle mortise. Mildly displaced medial malleolar fracture. Cortical irregularity of the posterior tibial tubercle consistent with nondisplaced fracture. The bones are under mineralized. There is widening of the medial clear space. There is diffuse soft tissue edema about the ankle.  IMPRESSION: Mildly displaced trimalleolar fracture.   Electronically Signed   By: Rubye Oaks M.D.   On: 01/05/2015 21:01   Ct Head Wo Contrast  01/05/2015   CLINICAL DATA:  81 year old female post fall. No loss of consciousness.  EXAM: CT HEAD WITHOUT CONTRAST  TECHNIQUE: Contiguous axial images were obtained from the base of the skull through the vertex without intravenous contrast.  COMPARISON:  04/24/2014  FINDINGS: Age-related atrophy, unchanged. No intracranial hemorrhage, mass effect, or midline shift. No hydrocephalus. The basilar cisterns are patent. No evidence of territorial infarct. No intracranial fluid collection. Calvarium is intact. Included paranasal sinuses and mastoid air cells are well aerated.  IMPRESSION: No acute intracranial abnormality.   Electronically Signed   By: Rubye Oaks M.D.   On: 01/05/2015 23:16   Dg C-arm 61-120 Min-no  Report  01/06/2015   CLINICAL DATA: fracture reduction   C-ARM 61-120 MINUTES  Fluoroscopy was utilized by the requesting physician.  No radiographic  interpretation.      ASSESSMENT AND PLAN:  81 y.o. female with a known history of hypertension, dementia, diet-controlled diabetes mellitus, hyperlipidemia, hypothyroidism, history of recurrent falls presents to the emergency room with the complaint of left ankle pain and swelling following mechanical fall sustained while taking a shower   *Trimalleolar fracture of left ankle  -POD #1 doing well PT started  * Dementia continue Aricept as taking at home and Namenda  *HTN (hypertension) blood pressure medications  To be resumed  * DM (diabetes mellitus) sliding scale insulin  * HLD (hyperlipidemia) currently not on any treatment at home  * Hypothyroidism continue Synthroid  CSW for d/c planning  Case discussed with Care Management/Social Worker. Management plans discussed with the patient, family and they are in agreement.  CODE STATUS: FULL  DVT Prophylaxis: per ortho  TOTAL TIME TAKING CARE OF THIS PATIENT:  minutes.  >50% time spent on counselling and coordination of care  POSSIBLE D/C IN 2 DAYS, DEPENDING ON CLINICAL CONDITION.   Sandor Arboleda M.D on 01/07/2015 at 3:20 PM  Between 7am to 6pm - Pager - [phone]  After 6pm go to [email or web address] - password [email or web address]  [facility]  Office  [phone]  CC: Primary care physician; No primary care provider on file.

## 2015-01-07 NOTE — Progress Notes (Signed)
Physical Therapy Treatment Patient Details Name: Sara Delgado MRN: [id number] DOB: 12-02-1926 Today's Date: 01/07/2015    History of Present Illness Patient reports she was going to the bathroom and fell at home.  She was getting ready to go out for her birthday, still went to dinner but family convinced her to go to the ER afterwards to get her leg checked out.      PT Comments    Pt continues to be very limited with what she is able to do in standing, but again she is able to keep weight off the L LE well.  She is able to do some very small hoping/shuffling with max assist, and shows good strength with LE exercises.   Follow Up Recommendations  SNF     Equipment Recommendations       Recommendations for Other Services       Precautions / Restrictions Precautions Precautions: Fall Restrictions LLE Weight Bearing: Touchdown weight bearing    Mobility  Bed Mobility Overal bed mobility: Needs Assistance Bed Mobility: Sit to Supine       Sit to supine: Min guard   General bed mobility comments: Pt does well swinging feet up into bed w/o direct assist  Transfers Overall transfer level: Needs assistance Equipment used: Rolling walker (2 wheeled) Transfers: Sit to/from Stand Sit to Stand: Mod assist;Max assist         General transfer comment: Pt does better this afternoon with standing, but still needs a lot of help with set up and physical assist to get to standing  Ambulation/Gait Ambulation/Gait assistance: Max assist Ambulation Distance (Feet): 2 Feet Assistive device: Rolling walker (2 wheeled)       General Gait Details: Pt still requires a lot of assist and constant cuing, but was able to use her UEs to mainatin upright and was able to do a few small hop/shuffles with the R foot to get back to the bed   Stairs            Wheelchair Mobility    Modified Rankin (Stroke Patients Only)       Balance                                     Cognition Arousal/Alertness: Awake/alert Behavior During Therapy: WFL for tasks assessed/performed Overall Cognitive Status:  (pt does appear to have some confusion t/o session)                      Exercises General Exercises - Lower Extremity Ankle Circles/Pumps: AROM;10 reps Quad Sets: 10 reps;Strengthening Short Arc Quad: Left;10 reps;Strengthening Heel Slides: Right;10 reps;AROM;Strengthening Hip ABduction/ADduction: AROM;10 reps Straight Leg Raises: AROM;5 reps    General Comments        Pertinent Vitals/Pain Pain Score:  (does not rate, has general ankle pain that is not severe)    Home Living                      Prior Function            PT Goals (current goals can now be found in the care plan section) Progress towards PT goals: Progressing toward goals    Frequency  BID    PT Plan Current plan remains appropriate    Co-evaluation             End of Session Equipment Utilized  During Treatment: Gait belt Activity Tolerance: Patient tolerated treatment well Patient left: with bed alarm set     Time: 1445-1511 PT Time Calculation (min) (ACUTE ONLY): 26 min  Charges:  $Gait Training: 8-22 mins $Therapeutic Exercise: 8-22 mins                    G Codes:     Loran Senters, PT, DPT [phone]  Malachi Pro 01/07/2015, 4:59 PM

## 2015-01-07 NOTE — Progress Notes (Signed)
   Subjective: 1 Day Post-Op Procedure(s) (LRB): OPEN REDUCTION INTERNAL FIXATION (ORIF) ANKLE FRACTURE (Left) Patient reports pain as 1 on 0-10 scale and 2 on 0-10 scale.   Patient is well, and has had no acute complaints or problems We will start therapy today.  Plan is to go Rehab after hospital stay.  Objective: Vital signs in last 24 hours: Temp:  [97.4 F (36.3 C)-99.3 F (37.4 C)] 99.3 F (37.4 C) (09/17 0800) Pulse Rate:  [49-147] 66 (09/17 0800) Resp:  [11-27] 18 (09/17 0800) BP: (126-147)/(40-79) 131/43 mmHg (09/17 0800) SpO2:  [94 %-100 %] 94 % (09/17 0800) Weight:  [141 lb 9.6 oz (64.229 kg)] 141 lb 9.6 oz (64.229 kg) (09/17 0500)  Intake/Output from previous day: 09/16 0701 - 09/17 0700 In: 1130 [P.O.:480; I.V.:500; IV Piggyback:150] Out: 750 [Urine:750] Intake/Output this shift:     Recent Labs  01/05/15 2042 01/07/15 0345  HGB 11.6* 10.3*    Recent Labs  01/05/15 2042 01/07/15 0345  WBC 8.8 8.2  RBC 3.84 3.31*  HCT 35.1 29.9*  PLT 205 148*    Recent Labs  01/05/15 2042 01/07/15 0345  NA 136 138  K 4.3 4.0  CL 103 105  CO2 26 28  BUN 29* 20  CREATININE 1.31* 0.87  GLUCOSE 175* 137*  CALCIUM 9.0 8.5*    Recent Labs  01/05/15 2042  INR 0.98    EXAM General - Patient is Alert, Appropriate and Oriented Extremity - Neurovascular intact Sensation intact distally Intact pulses distally Dressing - dressing C/D/I and no drainage Motor Function - intact, moving toes well on exam. Stirrup splint intact.  Past Medical History  Diagnosis Date  . Hypertension   . Diabetes mellitus without complication   . Hypothyroidism   . Dementia     Assessment/Plan:   1 Day Post-Op Procedure(s) (LRB): OPEN REDUCTION INTERNAL FIXATION (ORIF) ANKLE FRACTURE (Left) Principal Problem:   Trimalleolar fracture of left ankle Active Problems:   Dementia   HTN (hypertension)   DM (diabetes mellitus)   HLD (hyperlipidemia)    Hypothyroidism  Estimated body mass index is 22.87 kg/(m^2) as calculated from the following:   Height as of this encounter:  (1.676 m).   Weight as of this encounter: 141 lb 9.6 oz (64.229 kg). Advance diet Up with therapy, toe touch weight bearing LLE Plan on discharge to rehab Sunday or Monday  DVT Prophylaxis - Aspirin Toe touch weight bearing to left leg D/C O2 and Pulse OX and try on Room Air  T. Cranston Neighbor, PA-C [facility] 01/07/2015, 8:40 AM

## 2015-01-07 NOTE — Anesthesia Postprocedure Evaluation (Signed)
  Anesthesia Post-op Note  Patient: Sara Delgado  Procedure(s) Performed: Procedure(s): OPEN REDUCTION INTERNAL FIXATION (ORIF) ANKLE FRACTURE (Left)  Anesthesia type:Spinal  Patient location: PACU  Post pain: Pain level controlled  Post assessment: Post-op Vital signs reviewed, Patient's Cardiovascular Status Stable, Respiratory Function Stable, Patent Airway and No signs of Nausea or vomiting  Post vital signs: Reviewed and stable  Last Vitals:  Filed Vitals:   01/07/15 1510  BP: 136/42  Pulse: 62  Temp: 37.3 C  Resp: 16    Level of consciousness: awake, alert  and patient cooperative  Complications: No apparent anesthesia complications

## 2015-01-08 LAB — GLUCOSE, CAPILLARY
GLUCOSE-CAPILLARY: 89 mg/dL (ref 65–99)
GLUCOSE-CAPILLARY: 118 mg/dL — AB (ref 65–99)
Glucose-Capillary: 119 mg/dL — ABNORMAL HIGH (ref 65–99)
Glucose-Capillary: 151 mg/dL — ABNORMAL HIGH (ref 65–99)
Glucose-Capillary: 84 mg/dL (ref 65–99)
Glucose-Capillary: 90 mg/dL (ref 65–99)

## 2015-01-08 MED ORDER — HYDROCODONE-ACETAMINOPHEN 5-325 MG PO TABS: 1.0000 | ORAL_TABLET | ORAL | Status: DC | PRN

## 2015-01-08 MED ORDER — COENZYME Q10 30 MG PO CAPS: 100.0000 mg | ORAL_CAPSULE | Freq: Every day | ORAL | Status: DC

## 2015-01-08 NOTE — Progress Notes (Signed)
Physical Therapy Treatment Patient Details Name: Sara Delgado MRN: [id number] DOB: [birth year] Today's Date: 01/08/2015    History of Present Illness  (Fall, L ankle fx needing ORIF)    PT Comments    Pt shows much better ability to transfer and was actually able to ambulate with hopping technique and maintained TTWBing well.  She is still very limited with overall mobility but used arms much more effectively on the walker and despite some confusion and impulsivity she is able to follow instruction and with assist walk nearly 10 ft.   Follow Up Recommendations  SNF     Equipment Recommendations       Recommendations for Other Services       Precautions / Restrictions Precautions Precautions: Fall Restrictions Weight Bearing Restrictions: Yes LLE Weight Bearing: Touchdown weight bearing    Mobility  Bed Mobility Overal bed mobility: Modified Independent       Supine to sit: Min guard     General bed mobility comments: pt able to get LEs to EOB, heavy use of UEs to slowly get to sitting with only verbal cues   Transfers Overall transfer level: Needs assistance Equipment used: Rolling walker (2 wheeled) Transfers: Sit to/from Stand Sit to Stand: Min assist;Mod assist         General transfer comment: Pt is able to get her R foot underneath her and use it well this AM.  Pt with much more calm/control with standing.   Ambulation/Gait Ambulation/Gait assistance: Mod assist;Max assist Ambulation Distance (Feet): 8 Feet Assistive device: Rolling walker (2 wheeled)       General Gait Details: Pt was actually able to take some hopping steps with constant cuing & assist to unweight.  She shows good effort trying to keep weight off the L foot and generally is effective with only TTWBing.  Pt fatigues with the effort, but made huge gains and was able to use the walker more appropriately.      Stairs            Wheelchair Mobility    Modified Rankin (Stroke  Patients Only)       Balance                                    Cognition Arousal/Alertness: Awake/alert Behavior During Therapy: WFL for tasks assessed/performed Overall Cognitive Status:  (continues to have evidence of minimal confusion)                      Exercises General Exercises - Lower Extremity Ankle Circles/Pumps: AROM;10 reps (toe wiggling on L (pt with more control/motion today)) Quad Sets: 10 reps;Strengthening Short Arc Quad: Left;10 reps;Strengthening Heel Slides: Right;10 reps;AROM;Strengthening Hip ABduction/ADduction: AROM;10 reps Straight Leg Raises: AROM;10 reps    General Comments        Pertinent Vitals/Pain Pain Score:  (reports very minimal pain, just lateral ankle soreness)    Home Living                      Prior Function            PT Goals (current goals can now be found in the care plan section) Progress towards PT goals: Progressing toward goals    Frequency  BID    PT Plan Current plan remains appropriate    Co-evaluation  End of Session Equipment Utilized During Treatment: Gait belt Activity Tolerance: Patient limited by fatigue;Patient tolerated treatment well Patient left: with chair alarm set     Time: 1610-9604 PT Time Calculation (min) (ACUTE ONLY): 25 min  Charges:  $Gait Training: 8-22 mins $Therapeutic Exercise: 8-22 mins                    G Codes:     Sara Delgado, PT, DPT [phone]  Sara Delgado 01/08/2015, 10:38 AM

## 2015-01-08 NOTE — Progress Notes (Signed)
PROGRESS NOTE  PATIENT NAME: Sara Delgado DOB: 1927-02-12  MRN: [id number]  POD # 2: ORIF left trimalleolar ankle fracture   Subjective: Patient reports pain to be well controlled.  Objective: Vital signs in last 24 hours: Temp:  [98.5 F (36.9 C)-99.1 F (37.3 C)] 98.5 F (36.9 C) (09/18 0723) Pulse Rate:  [62-67] 66 (09/18 0723) Resp:  [16-18] 18 (09/18 0723) BP: (135-145)/(42-52) 135/52 mmHg (09/18 0723) SpO2:  [91 %-97 %] 96 % (09/18 0723) Weight:  [65.409 kg (144 lb 3.2 oz)] 65.409 kg (144 lb 3.2 oz) (09/18 0500)  Intake/Output from previous day: 09/17 0701 - 09/18 0700 In: 240 [P.O.:240] Out: 200 [Urine:200]   Recent Labs  01/05/15 2042 01/07/15 0345  WBC 8.8 8.2  HGB 11.6* 10.3*  HCT 35.1 29.9*  PLT 205 148*  K 4.3 4.0  CL 103 105  CO2 26 28  BUN 29* 20  CREATININE 1.31* 0.87  GLUCOSE 175* 137*  CALCIUM 9.0 8.5*  INR 0.98  --     EXAM General - Patient is Alert, Appropriate and Oriented  Lungs - CTA Cor - RRR, normal S1S2, no gross murmur Left Lower Extremity - Short leg splint intact. Good capillary refill. Dressing/Splint - dressing C/D/I Motor Function - Sensory and motor function intact.  Assessment: S/P ORIF left tyrimalleolar ankle fracture   Principal Problem:   Trimalleolar fracture of left ankle Active Problems:   Dementia   HTN (hypertension)   DM (diabetes mellitus)   HLD (hyperlipidemia)   Hypothyroidism  Plan: Notes from PT reviewed. Continue PT/OT. Plan is to go Skilled nursing facility after hospital stay. DVT Prophylaxis - Aspirin  James P. Angie Fava M.D.

## 2015-01-08 NOTE — Progress Notes (Addendum)
[facility] - [place] at [facility]   PATIENT NAME: Sara Delgado    MR#:  [id number]  DATE OF BIRTH:  [birth year]  SUBJECTIVE:   No complaints REVIEW OF SYSTEMS:   Review of Systems  Constitutional: Negative for fever, chills and weight loss.  HENT: Negative for ear discharge, ear pain and nosebleeds.   Eyes: Negative for blurred vision, pain and discharge.  Respiratory: Negative for sputum production, shortness of breath, wheezing and stridor.   Cardiovascular: Negative for chest pain, palpitations, orthopnea and PND.  Gastrointestinal: Negative for nausea, vomiting, abdominal pain and diarrhea.  Genitourinary: Negative for urgency and frequency.  Musculoskeletal: Positive for joint pain. Negative for back pain.  Neurological: Negative for sensory change, speech change, focal weakness and weakness.  Psychiatric/Behavioral: Negative for depression. The patient is not nervous/anxious.   All other systems reviewed and are negative.  Tolerating Diet:yes Tolerating PT: yes  DRUG ALLERGIES:   Allergies  Allergen Reactions  . Atorvastatin Swelling    Swelling at higher doses  . Metformin And Related     VITALS:  Blood pressure 135/52, pulse 66, temperature 98.5 F (36.9 C), temperature source Oral, resp. rate 18, height  (1.676 m), weight 65.409 kg (144 lb 3.2 oz), SpO2 96 %.  PHYSICAL EXAMINATION:   Physical Exam  GENERAL:  81 y.o.-year-old patient lying in the bed with no acute distress.  EYES: Pupils equal, round, reactive to light and accommodation. No scleral icterus. Extraocular muscles intact.  HEENT: Head atraumatic, normocephalic. Oropharynx and nasopharynx clear.  NECK:  Supple, no jugular venous distention. No thyroid enlargement, no tenderness.  LUNGS: Normal breath sounds bilaterally, no wheezing, rales, rhonchi. No use of accessory muscles of respiration.  CARDIOVASCULAR: S1, S2 normal. No murmurs, rubs, or gallops.  ABDOMEN: Soft,  nontender, nondistended. Bowel sounds present. No organomegaly or mass.  EXTREMITIES: No cyanosis, clubbing or edema b/l.    NEUROLOGIC: Cranial nerves II through XII are intact. No focal Motor or sensory deficits b/l.   PSYCHIATRIC: The patient is alert and oriented x 3.  SKIN: No obvious rash, lesion, or ulcer.    LABORATORY PANEL:   CBC  Recent Labs Lab 01/07/15 0345  WBC 8.2  HGB 10.3*  HCT 29.9*  PLT 148*    Chemistries   Recent Labs Lab 01/07/15 0345  NA 138  K 4.0  CL 105  CO2 28  GLUCOSE 137*  BUN 20  CREATININE 0.87  CALCIUM 8.5*    Cardiac Enzymes No results for input(s): TROPONINI in the last 168 hours.  RADIOLOGY:  Dg Ankle 2 Views Left  01/06/2015   CLINICAL DATA:  Intraoperative imaging during the ORIF of left ankle fractures. Postoperative AP and lateral views of the left ankle.  EXAM: DG C-ARM 61-120 MIN-NO REPORT; LEFT ANKLE - 2 VIEW  COMPARISON:  01/05/2015  FINDINGS: Single portable operative image shows placement of a lateral fixation plate and multiple screws along the distal fibula a single screw inserted from the inferior medial malleolus into the distal tibial metaphysis. Fractures appear well aligned. Ankle mortise is well aligned.  Postoperative images of the left ankle, with the ankle supported in a fiberglass cast, showed the orthopedic hardware reducing the distal fibular and medial malleolar fractures into near anatomic alignment. The ankle mortise is normally spaced and aligned. The orthopedic hardware is well-seated.  IMPRESSION: Well-aligned left ankle fracture fragments following ORIF.   Electronically Signed   By: Amie Portland M.D.   On: 01/06/2015 15:38  Dg C-arm 61-120 Min-no Report  01/06/2015   CLINICAL DATA: fracture reduction   C-ARM 61-120 MINUTES  Fluoroscopy was utilized by the requesting physician.  No radiographic  interpretation.      ASSESSMENT AND PLAN:  81 y.o. female with a known history of hypertension, dementia,  diet-controlled diabetes mellitus, hyperlipidemia, hypothyroidism, history of recurrent falls presents to the emergency room with the complaint of left ankle pain and swelling following mechanical fall sustained while taking a shower   *Trimalleolar fracture of left ankle  -POD #2 doing well PT started  * Dementia continue Aricept as taking at home and Namenda  *HTN (hypertension) blood pressure medications  To be resumed  * DM (diabetes mellitus) sliding scale insulin  * HLD (hyperlipidemia) currently not on any treatment at home  * Hypothyroidism continue Synthroid  CSW for d/c planningto rehab in am  Case discussed with Care Management/Social Worker. Management plans discussed with the patient, family and they are in agreement.  CODE STATUS: FULL  DVT Prophylaxis:per ortho  TOTAL TIME TAKING CARE OF THIS PATIENT:  minutes.  >50% time spent on counselling and coordination of care  POSSIBLE D/C IN 2 DAYS, DEPENDING ON CLINICAL CONDITION.   Viney Acocella M.D on 01/08/2015 at 1:41 PM  Between 7am to 6pm - Pager - [phone]  After 6pm go to [email or web address] - password [email or web address] [facility]  [facility]  Office  [phone]  CC: Primary care physician; No primary care provider on file.

## 2015-01-08 NOTE — Discharge Instructions (Signed)
Diet: As you were doing prior to hospitalization   Shower:  May shower but keep the wounds dry, use an occlusive plastic wrap, NO SOAKING IN TUB.  If the bandage gets wet, change with a clean dry gauze.  Dressing:  You may change your dressing as needed. Change the dressing with sterile gauze dressing.    Activity:  Increase activity slowly as tolerated. Toe touch weight bearing.  No lifting or driving for 6 weeks.  Weight Bearing:   Toe touch weight bearing Left lower extremity  To prevent constipation: you may use a stool softener such as -  Colace (over the counter) 100 mg by mouth twice a day  Drink plenty of fluids (prune juice may be helpful) and high fiber foods Miralax (over the counter) for constipation as needed.    Itching:  If you experience itching with your medications, try taking only a single pain pill, or even half a pain pill at a time.  You may take up to 10 pain pills per day, and you can also use benadryl over the counter for itching or also to help with sleep.   Precautions:  If you experience chest pain or shortness of breath - call 911 immediately for transfer to the hospital emergency department!!  If you develop a fever greater that 101 F, purulent drainage from wound, increased redness or drainage from wound, or calf pain-Call [facility]                                              Follow- Up Appointment:  Please call for an appointment to be seen in 10 days at [facility]

## 2015-01-08 NOTE — Progress Notes (Signed)
PHARMACIST - PHYSICIAN ORDER COMMUNICATION  CONCERNING: P&T Medication Policy on Herbal Medications  DESCRIPTION:  This patient's order for:  Co-enzyme Q10 has been noted.  This product(s) is classified as an "herbal" or natural product. Due to a lack of definitive safety studies or FDA approval, nonstandard manufacturing practices, plus the potential risk of unknown drug-drug interactions while on inpatient medications, the Pharmacy and Therapeutics Committee does not permit the use of "herbal" or natural products of this type within [place].   ACTION TAKEN: The pharmacy department is unable to verify this order at this time and your patient has been informed of this safety policy. Please reevaluate patient's clinical condition at discharge and address if the herbal or natural product(s) should be resumed at that time.   

## 2015-01-09 ENCOUNTER — Encounter: Admission: RE | Admit: 2015-01-09 | Payer: Medicare Other | Source: Ambulatory Visit | Admitting: Internal Medicine

## 2015-01-09 LAB — GLUCOSE, CAPILLARY
GLUCOSE-CAPILLARY: 98 mg/dL (ref 65–99)
Glucose-Capillary: 100 mg/dL — ABNORMAL HIGH (ref 65–99)
Glucose-Capillary: 108 mg/dL — ABNORMAL HIGH (ref 65–99)
Glucose-Capillary: 87 mg/dL (ref 65–99)

## 2015-01-09 MED ORDER — ENOXAPARIN SODIUM 40 MG/0.4ML ~~LOC~~ SOLN: 40.0000 mg | SUBCUTANEOUS | Status: DC

## 2015-01-09 MED ORDER — DOCUSATE SODIUM 100 MG PO CAPS: 100.0000 mg | ORAL_CAPSULE | Freq: Two times a day (BID) | ORAL | Status: DC

## 2015-01-09 NOTE — Progress Notes (Addendum)
Report called to Sara Delgado at [facility] place.vss .low leg cast applied today. Facility aware of toe touch wt bearing status. Ems called

## 2015-01-09 NOTE — Progress Notes (Signed)
CSW was notified by MD that Pt has been medically cleared for dc to SNF. CSW followed up with SNF and [facility] has made a bed offer. This morning, Pt's daugther Phil Dopp is in room asking for details regarding dc plan. CSW updated daughter on plan as she had discussed with her mother. CSW received authorization for SNF from [employer] for [facility].  Pt's daughter going to get clothes for Pt. CSW prepared dc packet and sent dc summary. RN to call report and EMS for transport.   Wilford Grist, LCSW [phone]

## 2015-01-09 NOTE — Progress Notes (Signed)
Physical Therapy Treatment Patient Details Name: Sara Delgado MRN: [id number] DOB: Feb 01, 1927 Today's Date: 01/09/2015    History of Present Illness Patient reports she was going to the bathroom and fell at home.  She was getting ready to go out for her birthday, still went to dinner but family convinced her to go to the ER afterwards to get her leg checked out.      PT Comments    Pt notes feeling well/denies pain. Pt demonstrates good compliance with TWB on L with transfers and short ambulation bed to chair. Pt does require Mod A for out of bed upright activities. Pt's family notes pt impulsive at times and thinks she can get up on her own. Reinforced need for pt to call for assistance for out of bed/chair. Pt states she understands. Phone and call bell within pt's reach. Pt currently has discharge orders to skilled nursing facility today.   Follow Up Recommendations  SNF     Equipment Recommendations       Recommendations for Other Services       Precautions / Restrictions Precautions Precautions: Fall Restrictions Weight Bearing Restrictions: Yes LLE Weight Bearing: Touchdown weight bearing    Mobility  Bed Mobility Overal bed mobility: Needs Assistance Bed Mobility: Supine to Sit     Supine to sit: Min assist (increased time)     General bed mobility comments: Able to bridge hips over to edge of bed; slowly progresses LEs over edge of bed. Min A for trunk   Transfers Overall transfer level: Needs assistance Equipment used: Rolling walker (2 wheeled) Transfers: Sit to/from Stand Sit to Stand: Mod assist         General transfer comment: Mod A and cues for squeezing posterior hip muscles for upright posture; assist to steady and cues for TWB versus holdling LLE up for improved balance. Cues for proper hand placement stand to sit  Ambulation/Gait Ambulation/Gait assistance: Mod assist Ambulation Distance (Feet): 3 Feet Assistive device: Rolling walker (2  wheeled) Gait Pattern/deviations:  (slight hops with Mod A for support/balance)   Gait velocity interpretation: <1.8 ft/sec, indicative of risk for recurrent falls General Gait Details: Compliant with TWB on L; requires Mod A to balance to allow for slight hops to chair.    Stairs            Wheelchair Mobility    Modified Rankin (Stroke Patients Only)       Balance                                    Cognition Arousal/Alertness: Awake/alert Behavior During Therapy: WFL for tasks assessed/performed Overall Cognitive Status: Within Functional Limits for tasks assessed                      Exercises General Exercises - Lower Extremity Ankle Circles/Pumps: AROM;Right;20 reps;Supine Quad Sets: Strengthening;Both;20 reps;Supine Gluteal Sets: Strengthening;Both;20 reps;Supine Short Arc Quad: AROM;Both;20 reps;Supine Heel Slides: AROM;Both;20 reps;Supine Hip ABduction/ADduction: AROM;Both;20 reps;Supine (Assist only to keep LEs off bed to avoid friction) Straight Leg Raises: Both;AAROM;20 reps;Supine    General Comments        Pertinent Vitals/Pain Pain Assessment: No/denies pain    Home Living                      Prior Function            PT Goals (  current goals can now be found in the care plan section) Progress towards PT goals: Progressing toward goals    Frequency  BID    PT Plan Current plan remains appropriate    Co-evaluation             End of Session Equipment Utilized During Treatment: Gait belt Activity Tolerance: Patient tolerated treatment well Patient left: in chair;with call bell/phone within reach;with chair alarm set;with nursing/sitter in room;with family/visitor present     Time: 9604-5409 PT Time Calculation (min) (ACUTE ONLY): 36 min  Charges:  $Gait Training: 8-22 mins $Therapeutic Exercise: 8-22 mins                    G Codes:      Kristeen Miss 01/09/2015, 10:50 AM

## 2015-01-09 NOTE — Progress Notes (Signed)
   Subjective: 3 Days Post-Op Procedure(s) (LRB): OPEN REDUCTION INTERNAL FIXATION (ORIF) ANKLE FRACTURE (Left) Patient reports pain as mild.   Patient is well, and has had no acute complaints or problems We will start therapy today.  Plan is to go Skilled nursing facility after hospital stay.  Objective: Vital signs in last 24 hours: Temp:  [97.9 F (36.6 C)-99 F (37.2 C)] 98.9 F (37.2 C) (09/19 0731) Pulse Rate:  [56-62] 62 (09/19 0731) Resp:  [16-18] 16 (09/19 0731) BP: (123-154)/(44-55) 153/47 mmHg (09/19 0731) SpO2:  [95 %-98 %] 95 % (09/19 0731) Weight:  [64.365 kg (141 lb 14.4 oz)] 64.365 kg (141 lb 14.4 oz) (09/19 0500)  Intake/Output from previous day: 09/18 0701 - 09/19 0700 In: 720 [P.O.:720] Out: -  Intake/Output this shift:     Recent Labs  01/07/15 0345  HGB 10.3*    Recent Labs  01/07/15 0345  WBC 8.2  RBC 3.31*  HCT 29.9*  PLT 148*    Recent Labs  01/07/15 0345  NA 138  K 4.0  CL 105  CO2 28  BUN 20  CREATININE 0.87  GLUCOSE 137*  CALCIUM 8.5*   No results for input(s): LABPT, INR in the last 72 hours.  EXAM General - Patient is Alert, Appropriate and Oriented Extremity - Neurovascular intact Sensation intact distally Dressing - dressing C/D/I, stirrup splint intact Motor Function - intact, moving toes well on exam.   Past Medical History  Diagnosis Date  . Hypertension   . Diabetes mellitus without complication   . Hypothyroidism   . Dementia     Assessment/Plan:   3 Days Post-Op Procedure(s) (LRB): OPEN REDUCTION INTERNAL FIXATION (ORIF) ANKLE FRACTURE (Left) Principal Problem:   Trimalleolar fracture of left ankle Active Problems:   Dementia   HTN (hypertension)   DM (diabetes mellitus)   HLD (hyperlipidemia)   Hypothyroidism  Estimated body mass index is 22.91 kg/(m^2) as calculated from the following:   Height as of this encounter:  (1.676 m).   Weight as of this encounter: 64.365 kg (141 lb 14.4  oz). Up with therapy Discharge to SNF  Follow up with [facility] ortho in 8-10 days for staple removal and cast application.  DVT Prophylaxis - Aspirin Toe touch weight-Bearing LLE D/C O2 and Pulse OX and try on Room Air  T. Cranston Neighbor, PA-C [facility] 01/09/2015, 8:03 AM

## 2015-01-09 NOTE — Clinical Social Work Note (Addendum)
Clinical Social Work Assessment  Patient Details  Name: Sara Delgado MRN: [id number] Date of Birth: [birth year]  Date of referral:  01/08/15               Reason for consult:  Facility Placement                Permission sought to share information with:  Family Supports, Chartered certified accountant granted to share information::  Yes, Verbal Permission Granted  Name::        Agency::  SNFs  Relationship::  daughters and husband  Contact Information:     Housing/Transportation Living arrangements for the past 2 months:  Single Family Home Source of Information:  Patient Patient Interpreter Needed:  None Criminal Activity/Legal Involvement Pertinent to Current Situation/Hospitalization:  No - Comment as needed Significant Relationships:  Adult Children, Spouse, Community Support Lives with:  Spouse Do you feel safe going back to the place where you live?  Yes Need for family participation in patient care:  Yes (Comment)  Care giving concerns:  Pt lives with her husband of 69 years who has been to SNF multiple times himself. Pt is dependent with mobility at this time.    Social Worker assessment / plan: CSW met with Pt alone while family was out of hospital. 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  Pt is aware of recommendation of SNF and wold like to go to [facility] or [facility]  Pt's insurance will require preauthorization for SNF as well.  CSW will begin SNF auth today for likely dc on Monday per MD.  Employment status:  Retired Nurse, adult PT Recommendations:  [place] / Referral to community resources:  [place]  Patient/Family's Response to care: Pt pleasant and agreeable to SNF. Pt pleased with care while at [facility]. Pt has strong family support with frequent visitors.   Patient/Family's Understanding of and Emotional Response to  Diagnosis, Current Treatment, and Prognosis: Pt under estimates her mobility. She states to CSW that she has only been in the hospital to have babies and a gall bladder removed.    Emotional Assessment Appearance:  Appears stated age Attitude/Demeanor/Rapport:   (cooperative) Affect (typically observed):  Accepting Orientation:  Oriented to Self, Oriented to Place, Oriented to  Time, Oriented to Situation Alcohol / Substance use:  Never Used Psych involvement (Current and /or in the community):  No (Comment)  Discharge Needs  Concerns to be addressed:  Adjustment to Illness, Discharge Planning Concerns Readmission within the last 30 days:  No Current discharge risk:  Dependent with Mobility Barriers to Discharge:  Ship broker, Continued Medical Work up   [employer], LCSW 01/09/2015, 10:49 PM

## 2015-01-09 NOTE — Discharge Summary (Signed)
[facility] - [place] at [facility]   PATIENT NAME: Sara Delgado    MR#:  [id number]  DATE OF BIRTH:  1926/09/12  DATE OF ADMISSION:  01/05/2015 ADMITTING PHYSICIAN: Crissie Figures, MD  DATE OF DISCHARGE: 01/09/15  PRIMARY CARE PHYSICIAN: No primary care provider on file.    ADMISSION DIAGNOSIS:  Fall, initial encounter [W19.XXXA] Trimalleolar fracture of ankle, closed, left, initial encounter [S82.852A] Abrasion of left lower extremity, initial encounter [S80.812A] Nonintractable headache, unspecified chronicity pattern, unspecified headache type [R51]  DISCHARGE DIAGNOSIS:  Left ankle trimalleollar fracture s/p repair  SECONDARY DIAGNOSIS:   Past Medical History  Diagnosis Date  . Hypertension   . Diabetes mellitus without complication   . Hypothyroidism   . Dementia     HOSPITAL COURSE:   81 y.o. female with a known history of hypertension, dementia, diet-controlled diabetes mellitus, hyperlipidemia, hypothyroidism, history of recurrent falls presents to the emergency room with the complaint of left ankle pain and swelling following mechanical fall sustained while taking a shower   *Trimalleolar fracture of left ankle  -POD #3 doing well PT started  * Dementia continue Aricept as taking at home and Namenda  *HTN (hypertension) blood pressure medicationsresumed  * DM (diabetes mellitus) sliding scale insulin -not on any po meds at home  * HLD (hyperlipidemia) currently not on any treatment at home  * Hypothyroidism continue Synthroid Overall improving D/c to rehab today  *DVT prophylaxis on ASA per ortho   DISCHARGE CONDITIONS:   fair  CONSULTS OBTAINED:  Treatment Team:  Kennedy Bucker, MD  DRUG ALLERGIES:   Allergies  Allergen Reactions  . Atorvastatin Swelling    Swelling at higher doses  . Metformin And Related     DISCHARGE MEDICATIONS:   Current Discharge Medication List    START taking these medications    Details  docusate sodium (COLACE) 100 MG capsule Take 1 capsule (100 mg total) by mouth 2 (two) times daily. Qty: 10 capsule, Refills: 0    HYDROcodone-acetaminophen (NORCO/VICODIN) 5-325 MG per tablet Take 1-2 tablets by mouth every 4 (four) hours as needed (breakthrough pain). Qty: 30 tablet, Refills: 0      CONTINUE these medications which have NOT CHANGED   Details  amLODipine (NORVASC) 10 MG tablet Take 10 mg by mouth daily.    aspirin 81 MG tablet Take 81 mg by mouth daily.    atorvastatin (LIPITOR) 10 MG tablet Take 10 mg by mouth daily.    calcium-vitamin D (OSCAL WITH D) 500-200 MG-UNIT per tablet Take 2 tablets by mouth 2 (two) times daily. With meals    cholestyramine (QUESTRAN) 4 G packet Take 4 g by mouth 2 (two) times daily.    citalopram (CELEXA) 20 MG tablet Take 20 mg by mouth daily.    co-enzyme Q-10 30 MG capsule Take 100 mg by mouth daily.    donepezil (ARICEPT) 10 MG tablet Take 10 mg by mouth at bedtime.    levothyroxine (SYNTHROID, LEVOTHROID) 125 MCG tablet Take 125 mcg by mouth daily before breakfast. 6 am    loperamide (IMODIUM A-D) 2 MG tablet Take 2 mg by mouth 4 (four) times daily as needed for diarrhea or loose stools.    losartan (COZAAR) 100 MG tablet Take 100 mg by mouth daily.    magnesium oxide (MAG-OX) 400 MG tablet Take 400 mg by mouth daily.    memantine (NAMENDA) 5 MG tablet Take 5 mg by mouth.    Multiple Vitamins-Minerals (MULTIVITAMIN WITH MINERALS)  tablet Take 1 tablet by mouth daily.    omeprazole (PRILOSEC) 20 MG capsule Take 20 mg by mouth daily.        If you experience worsening of your admission symptoms, develop shortness of breath, life threatening emergency, suicidal or homicidal thoughts you must seek medical attention immediately by calling 911 or calling your MD immediately  if symptoms less severe.  You Must read complete instructions/literature along with all the possible adverse reactions/side effects for all  the Medicines you take and that have been prescribed to you. Take any new Medicines after you have completely understood and accept all the possible adverse reactions/side effects.   Please note  You were cared for by a hospitalist during your hospital stay. If you have any questions about your discharge medications or the care you received while you were in the hospital after you are discharged, you can call the unit and asked to speak with the hospitalist on call if the hospitalist that took care of you is not available. Once you are discharged, your primary care physician will handle any further medical issues. Please note that NO REFILLS for any discharge medications will be authorized once you are discharged, as it is imperative that you return to your primary care physician (or establish a relationship with a primary care physician if you do not have one) for your aftercare needs so that they can reassess your need for medications and monitor your lab values. Today   SUBJECTIVE   Doing well  VITAL SIGNS:  Blood pressure 148/44, pulse 62, temperature 99 F (37.2 C), temperature source Oral, resp. rate 18, height  (1.676 m), weight 64.365 kg (141 lb 14.4 oz), SpO2 98 %.  I/O:   Intake/Output Summary (Last 24 hours) at 01/09/15 0714 Last data filed at 01/08/15 1700  Gross per 24 hour  Intake    720 ml  Output      0 ml  Net    720 ml    PHYSICAL EXAMINATION:  GENERAL:  81 y.o.-year-old patient lying in the bed with no acute distress.  EYES: Pupils equal, round, reactive to light and accommodation. No scleral icterus. Extraocular muscles intact.  HEENT: Head atraumatic, normocephalic. Oropharynx and nasopharynx clear.  NECK:  Supple, no jugular venous distention. No thyroid enlargement, no tenderness.  LUNGS: Normal breath sounds bilaterally, no wheezing, rales,rhonchi or crepitation. No use of accessory muscles of respiration.  CARDIOVASCULAR: S1, S2 normal. No murmurs, rubs,  or gallops.  ABDOMEN: Soft, non-tender, non-distended. Bowel sounds present. No organomegaly or mass.  EXTREMITIES: No pedal edema, cyanosis, or clubbing. Surgical dressing +in LE NEUROLOGIC: Cranial nerves II through XII are intact. Muscle strength 5/5 in all extremities. Sensation intact. Gait not checked.  PSYCHIATRIC: The patient is alert and oriented x 3.  SKIN: No obvious rash, lesion, or ulcer.   DATA REVIEW:   CBC   Recent Labs Lab 01/07/15 0345  WBC 8.2  HGB 10.3*  HCT 29.9*  PLT 148*    Chemistries   Recent Labs Lab 01/07/15 0345  NA 138  K 4.0  CL 105  CO2 28  GLUCOSE 137*  BUN 20  CREATININE 0.87  CALCIUM 8.5*    Microbiology Results   Recent Results (from the past 240 hour(s))  Surgical pcr screen     Status: None   Collection Time: 01/06/15  2:50 AM  Result Value Ref Range Status   MRSA, PCR NEGATIVE NEGATIVE Final   Staphylococcus aureus NEGATIVE NEGATIVE Final  Comment:        The Xpert SA Assay (FDA approved for NASAL specimens in patients over 46 years of age), is one component of a comprehensive surveillance program.  Test performance has been validated by [facility] for patients greater than or equal to 60 year old. It is not intended to diagnose infection nor to guide or monitor treatment.     RADIOLOGY:  No results found.   Management plans discussed with the patient and in agreement.  CODE STATUS:     Code Status Orders        Start     Ordered   01/06/15 1608  Full code   Continuous     01/06/15 1607    Advance Directive Documentation        Most Recent Value   Type of Advance Directive  Living will   Pre-existing out of facility DNR order (yellow form or pink MOST form)     "MOST" Form in Place?        TOTAL TIME TAKING CARE OF THIS PATIENT: 40 minutes.    PATEL,SONA M.D on 01/09/2015 at 7:14 AM  Between 7am to 6pm - Pager - [phone] After 6pm go to [email or web address] - password [email or web address] [facility]  [place]  [facility]  Office  [phone]  CC: Primary care physician; No primary care provider on file.

## 2015-01-09 NOTE — Clinical Social Work Placement (Signed)
   CLINICAL SOCIAL WORK PLACEMENT  NOTE  Date:  01/09/2015  Patient Details  Name: Sara Delgado MRN: [id number] Date of Birth: 1926-05-06  Clinical Social Work is seeking post-discharge placement for this patient at the Skilled  Nursing Facility level of care (*CSW will initial, date and re-position this form in  chart as items are completed):  Yes   Patient/family provided with [place] Clinical Social Work Department's list of facilities offering this level of care within the geographic area requested by the patient (or if unable, by the patient's family).  Yes   Patient/family informed of their freedom to choose among providers that offer the needed level of care, that participate in Medicare, Medicaid or managed care program needed by the patient, have an available bed and are willing to accept the patient.  Yes   Patient/family informed of [facility]'s ownership interest in [facility] and [facility], as well as of the fact that they are under no obligation to receive care at these facilities.  PASRR submitted to EDS on 01/09/15     PASRR number received on 01/09/15     Existing PASRR number confirmed on       FL2 transmitted to all facilities in geographic area requested by pt/family on 01/08/15     FL2 transmitted to all facilities within larger geographic area on       Patient informed that his/her managed care company has contracts with or will negotiate with certain facilities, including the following:        Yes   Patient/family informed of bed offers received.  Patient chooses bed at [facility]     Physician recommends and patient chooses bed at      Patient to be transferred to [facility] on 01/09/15.  Patient to be transferred to facility by EMS     Patient family notified on 01/09/15 of transfer.  Name of family member notified:  Tammi, daughter     PHYSICIAN       Additional Comment:     _______________________________________________ Ned Card, LCSW 01/09/2015, 10:24 PM

## 2015-01-09 NOTE — Care Management Important Message (Signed)
Important Message  Patient Details  Name: Sara Delgado MRN: [id number] Date of Birth: [birth year]   Medicare Important Message Given:  Yes-second notification given    Olegario Messier A Allmond 01/09/2015, 9:26 AM

## 2015-01-09 NOTE — Progress Notes (Signed)
DISCHARGED TO [facility] VIA EMS

## 2015-01-10 LAB — GLUCOSE, CAPILLARY: GLUCOSE-CAPILLARY: 79 mg/dL (ref 65–99)

## 2015-01-18 ENCOUNTER — Encounter
Admission: RE | Admit: 2015-01-18 | Discharge: 2015-01-18 | Disposition: A | Discharge status: Home / Self Care | Payer: Medicare Other | Source: Skilled Nursing Facility | Attending: Internal Medicine | Admitting: Internal Medicine

## 2015-01-18 DIAGNOSIS — R410 Disorientation, unspecified: Secondary | ICD-10-CM | POA: Diagnosis not present

## 2015-01-18 LAB — CBC WITH DIFFERENTIAL/PLATELET
BASOS PCT: 1 %
Basophils Absolute: 0 10*3/uL (ref 0–0.1)
Eosinophils Absolute: 0.1 10*3/uL (ref 0–0.7)
Eosinophils Relative: 1 %
HEMATOCRIT: 34.8 % — AB (ref 35.0–47.0)
HEMOGLOBIN: 12 g/dL (ref 12.0–16.0)
LYMPHS PCT: 19 %
Lymphs Abs: 1.4 10*3/uL (ref 1.0–3.6)
MCH: 31.1 pg (ref 26.0–34.0)
MCHC: 34.5 g/dL (ref 32.0–36.0)
MCV: 90 fL (ref 80.0–100.0)
MONO ABS: 0.3 10*3/uL (ref 0.2–0.9)
MONOS PCT: 5 %
NEUTROS ABS: 5.6 10*3/uL (ref 1.4–6.5)
NEUTROS PCT: 74 %
Platelets: 391 10*3/uL (ref 150–440)
RBC: 3.86 MIL/uL (ref 3.80–5.20)
RDW: 13.2 % (ref 11.5–14.5)
WBC: 7.5 10*3/uL (ref 3.6–11.0)

## 2015-01-18 LAB — COMPREHENSIVE METABOLIC PANEL
ALBUMIN: 4.2 g/dL (ref 3.5–5.0)
ALK PHOS: 83 U/L (ref 38–126)
ALT: 15 U/L (ref 14–54)
AST: 25 U/L (ref 15–41)
Anion gap: 8 (ref 5–15)
BILIRUBIN TOTAL: 0.7 mg/dL (ref 0.3–1.2)
BUN: 24 mg/dL — AB (ref 6–20)
CALCIUM: 9.2 mg/dL (ref 8.9–10.3)
CO2: 25 mmol/L (ref 22–32)
CREATININE: 0.95 mg/dL (ref 0.44–1.00)
Chloride: 99 mmol/L — ABNORMAL LOW (ref 101–111)
GFR calc Af Amer: >60 mL/min (ref >60)
GFR calc non Af Amer: 53 mL/min — ABNORMAL LOW (ref >60)
GLUCOSE: 144 mg/dL — AB (ref 65–99)
Potassium: 4.3 mmol/L (ref 3.5–5.1)
SODIUM: 132 mmol/L — AB (ref 135–145)
TOTAL PROTEIN: 7.1 g/dL (ref 6.5–8.1)

## 2015-01-18 LAB — URINALYSIS COMPLETE WITH MICROSCOPIC (ARMC ONLY)
Bilirubin Urine: NEGATIVE
Glucose, UA: NEGATIVE mg/dL
Hgb urine dipstick: NEGATIVE
KETONES UR: NEGATIVE mg/dL
Nitrite: NEGATIVE
PH: 5 (ref 5.0–8.0)
PROTEIN: NEGATIVE mg/dL
RBC / HPF: NONE SEEN RBC/hpf (ref 0–5)
Specific Gravity, Urine: 1.018 (ref 1.005–1.030)

## 2015-01-18 LAB — TSH: TSH: 3.35 u[IU]/mL (ref 0.350–4.500)

## 2015-01-18 LAB — MAGNESIUM: Magnesium: 2.4 mg/dL (ref 1.7–2.4)

## 2015-01-19 LAB — VITAMIN B12: Vitamin B-12: 626 pg/mL (ref 180–914)

## 2015-01-20 LAB — VITAMIN D 25 HYDROXY (VIT D DEFICIENCY, FRACTURES): Vit D, 25-Hydroxy: 31 ng/mL (ref 30.0–100.0)

## 2015-01-21 ENCOUNTER — Encounter
Admission: RE | Admit: 2015-01-21 | Discharge: 2015-01-21 | Disposition: A | Discharge status: Home / Self Care | Payer: Medicare Other | Source: Ambulatory Visit | Attending: Internal Medicine | Admitting: Internal Medicine

## 2015-01-21 DIAGNOSIS — E871 Hypo-osmolality and hyponatremia: Secondary | ICD-10-CM | POA: Insufficient documentation

## 2015-01-21 LAB — URINE CULTURE

## 2015-01-26 DIAGNOSIS — E871 Hypo-osmolality and hyponatremia: Secondary | ICD-10-CM | POA: Diagnosis present

## 2015-01-26 LAB — BASIC METABOLIC PANEL
ANION GAP: 9 (ref 5–15)
BUN: 24 mg/dL — ABNORMAL HIGH (ref 6–20)
CALCIUM: 8.6 mg/dL — AB (ref 8.9–10.3)
CO2: 26 mmol/L (ref 22–32)
Chloride: 97 mmol/L — ABNORMAL LOW (ref 101–111)
Creatinine, Ser: 0.78 mg/dL (ref 0.44–1.00)
GFR calc Af Amer: >60 mL/min (ref >60)
GFR calc non Af Amer: >60 mL/min (ref >60)
GLUCOSE: 71 mg/dL (ref 65–99)
POTASSIUM: 4.4 mmol/L (ref 3.5–5.1)
Sodium: 132 mmol/L — ABNORMAL LOW (ref 135–145)

## 2015-05-20 ENCOUNTER — Emergency Department: Payer: Medicare Other

## 2015-05-20 ENCOUNTER — Emergency Department
Admission: EM | Admit: 2015-05-20 | Discharge: 2015-05-20 | Disposition: A | Discharge status: Home / Self Care | Payer: Medicare Other | Attending: Emergency Medicine | Admitting: Emergency Medicine

## 2015-05-20 ENCOUNTER — Encounter: Payer: Self-pay | Admitting: Emergency Medicine

## 2015-05-20 DIAGNOSIS — S79922A Unspecified injury of left thigh, initial encounter: Secondary | ICD-10-CM | POA: Diagnosis present

## 2015-05-20 DIAGNOSIS — F0390 Unspecified dementia without behavioral disturbance: Secondary | ICD-10-CM | POA: Insufficient documentation

## 2015-05-20 DIAGNOSIS — Z79899 Other long term (current) drug therapy: Secondary | ICD-10-CM | POA: Insufficient documentation

## 2015-05-20 DIAGNOSIS — E119 Type 2 diabetes mellitus without complications: Secondary | ICD-10-CM | POA: Insufficient documentation

## 2015-05-20 DIAGNOSIS — E039 Hypothyroidism, unspecified: Secondary | ICD-10-CM | POA: Insufficient documentation

## 2015-05-20 DIAGNOSIS — Y9389 Activity, other specified: Secondary | ICD-10-CM | POA: Diagnosis not present

## 2015-05-20 DIAGNOSIS — Z7982 Long term (current) use of aspirin: Secondary | ICD-10-CM | POA: Diagnosis not present

## 2015-05-20 DIAGNOSIS — S76212A Strain of adductor muscle, fascia and tendon of left thigh, initial encounter: Secondary | ICD-10-CM | POA: Diagnosis not present

## 2015-05-20 DIAGNOSIS — I10 Essential (primary) hypertension: Secondary | ICD-10-CM | POA: Insufficient documentation

## 2015-05-20 DIAGNOSIS — Y998 Other external cause status: Secondary | ICD-10-CM | POA: Insufficient documentation

## 2015-05-20 DIAGNOSIS — S76219A Strain of adductor muscle, fascia and tendon of unspecified thigh, initial encounter: Secondary | ICD-10-CM

## 2015-05-20 DIAGNOSIS — S39011A Strain of muscle, fascia and tendon of abdomen, initial encounter: Secondary | ICD-10-CM

## 2015-05-20 DIAGNOSIS — Y9289 Other specified places as the place of occurrence of the external cause: Secondary | ICD-10-CM | POA: Insufficient documentation

## 2015-05-20 DIAGNOSIS — W1839XA Other fall on same level, initial encounter: Secondary | ICD-10-CM | POA: Diagnosis not present

## 2015-05-20 MED ORDER — TRAMADOL HCL 50 MG PO TABS
50.0000 mg | ORAL_TABLET | Freq: Once | ORAL | Status: AC
Start: 2015-05-20 — End: 2015-05-20
  Administered 2015-05-20: 50 mg via ORAL
  Filled 2015-05-20: qty 1

## 2015-05-20 MED ORDER — KETOROLAC TROMETHAMINE 10 MG PO TABS: 10.0000 mg | ORAL_TABLET | Freq: Four times a day (QID) | ORAL | Status: DC | PRN

## 2015-05-20 MED ORDER — TRAMADOL HCL 50 MG PO TABS: 50.0000 mg | ORAL_TABLET | Freq: Two times a day (BID) | ORAL | Status: DC | PRN

## 2015-05-20 MED ORDER — KETOROLAC TROMETHAMINE 60 MG/2ML IM SOLN
30.0000 mg | Freq: Once | INTRAMUSCULAR | Status: AC
  Administered 2015-05-20: 30 mg via INTRAMUSCULAR

## 2015-05-20 MED ORDER — KETOROLAC TROMETHAMINE 30 MG/ML IJ SOLN: 30.0000 mg | Freq: Once | INTRAMUSCULAR | Status: DC

## 2015-05-20 MED ORDER — KETOROLAC TROMETHAMINE 30 MG/ML IJ SOLN
30.0000 mg | Freq: Once | INTRAMUSCULAR | Status: DC
  Filled 2015-05-20: qty 1

## 2015-05-20 NOTE — ED Notes (Signed)
Pt discharged to home.  Family member driving.  Discharge instructions reviewed.  Verbalized understanding.  No questions or concerns at this time.  Teach back verified.  Pt in NAD.  No items left in ED.   

## 2015-05-20 NOTE — ED Notes (Signed)
Patient reports that she fell getting in the shower about 3-4 days ago. Patient with complaint of left groin pain. Patient states that the pain is worse with movement.

## 2015-05-20 NOTE — ED Provider Notes (Signed)
[facility] Emergency Department Provider Note  ____________________________________________  Time seen: Approximately 9:51 PM  I have reviewed the triage vital signs and the nursing notes.   HISTORY  Chief Complaint Fall and Groin Pain    HPI Sara Delgado is a 82 y.o. female patient complaining of left groin pain secondary to a fall 4 days ago. Patient states she fell getting out of a shower landing on her left side. Patient states she is able to bear weight but have pain with flexion of her left lower extremity. Patient stated pain radiates from the left groin area into her thigh. Patient said her pain is zero until she tries to flex her left lower extremityand the pain as a 5/10. No palliative measures taken for this complaint.  Past Medical History  Diagnosis Date  . Hypertension   . Diabetes mellitus without complication (HCC)   . Hypothyroidism   . Dementia     Patient Active Problem List   Diagnosis Date Noted  . Trimalleolar fracture of left ankle 01/05/2015  . Dementia 01/05/2015  . HTN (hypertension) 01/05/2015  . DM (diabetes mellitus) (HCC) 01/05/2015  . HLD (hyperlipidemia) 01/05/2015  . Hypothyroidism 01/05/2015    Past Surgical History  Procedure Laterality Date  . Appendectomy    . Eye surgery    . Tonsillectomy    . Cholecystectomy    . Orif ankle fracture Left 01/06/2015    Procedure: OPEN REDUCTION INTERNAL FIXATION (ORIF) ANKLE FRACTURE;  Surgeon: Kennedy Bucker, MD;  Location: [facility] ORS;  Service: Orthopedics;  Laterality: Left;    Current Outpatient Rx  Name  Route  Sig  Dispense  Refill  . amLODipine (NORVASC) 10 MG tablet   Oral   Take 10 mg by mouth daily.         Marland Kitchen aspirin 81 MG tablet   Oral   Take 81 mg by mouth daily.         Marland Kitchen atorvastatin (LIPITOR) 10 MG tablet   Oral   Take 10 mg by mouth daily.         . calcium-vitamin D (OSCAL WITH D) 500-200 MG-UNIT per tablet   Oral   Take 2 tablets by mouth 2  (two) times daily. With meals         . cholestyramine (QUESTRAN) 4 G packet   Oral   Take 4 g by mouth 2 (two) times daily.         . citalopram (CELEXA) 20 MG tablet   Oral   Take 20 mg by mouth daily.         Marland Kitchen co-enzyme Q-10 30 MG capsule   Oral   Take 100 mg by mouth daily.         Marland Kitchen docusate sodium (COLACE) 100 MG capsule   Oral   Take 1 capsule (100 mg total) by mouth 2 (two) times daily.   10 capsule   0   . donepezil (ARICEPT) 10 MG tablet   Oral   Take 10 mg by mouth at bedtime.         Marland Kitchen HYDROcodone-acetaminophen (NORCO/VICODIN) 5-325 MG per tablet   Oral   Take 1-2 tablets by mouth every 4 (four) hours as needed (breakthrough pain).   30 tablet   0   . ketorolac (TORADOL) 10 MG tablet   Oral   Take 1 tablet (10 mg total) by mouth every 6 (six) hours as needed.   20 tablet   0   .  levothyroxine (SYNTHROID, LEVOTHROID) 125 MCG tablet   Oral   Take 125 mcg by mouth daily before breakfast. 6 am         . loperamide (IMODIUM A-D) 2 MG tablet   Oral   Take 2 mg by mouth 4 (four) times daily as needed for diarrhea or loose stools.         Marland Kitchen losartan (COZAAR) 100 MG tablet   Oral   Take 100 mg by mouth daily.         . magnesium oxide (MAG-OX) 400 MG tablet   Oral   Take 400 mg by mouth daily.         . memantine (NAMENDA) 5 MG tablet   Oral   Take 5 mg by mouth.         . Multiple Vitamins-Minerals (MULTIVITAMIN WITH MINERALS) tablet   Oral   Take 1 tablet by mouth daily.         Marland Kitchen omeprazole (PRILOSEC) 20 MG capsule   Oral   Take 20 mg by mouth daily.         . traMADol (ULTRAM) 50 MG tablet   Oral   Take 1 tablet (50 mg total) by mouth every 12 (twelve) hours as needed for moderate pain.   12 tablet   0     Allergies Atorvastatin and Metformin and related  Family History  Problem Relation Age of Onset  . CAD Mother   . CAD Father   . Dementia Sister   . CAD Brother   . Cancer Brother     Social  History Social History  Substance Use Topics  . Smoking status: Never Smoker   . Smokeless tobacco: None  . Alcohol Use: No    Review of Systems Constitutional: No fever/chills Eyes: No visual changes. ENT: No sore throat. Cardiovascular: Denies chest pain. Respiratory: Denies shortness of breath. Gastrointestinal: No abdominal pain.  No nausea, no vomiting.  No diarrhea.  No constipation. Genitourinary: Negative for dysuria. Musculoskeletal: Left hip and leg pain Skin: Negative for rash. Neurological: Negative for headaches, focal weakness or numbness. Psychiatric:Dementia Endocrine:Hypothyroidism, hypertension, and diabetes. Hematological/Lymphatic: Allergic/Immunilogical: See medication list  10-point ROS otherwise negative.  ____________________________________________   PHYSICAL EXAM:  VITAL SIGNS: ED Triage Vitals  Enc Vitals Group     BP 05/20/15 1958 146/56 mmHg     Pulse Rate 05/20/15 1958 64     Resp 05/20/15 1958 18     Temp 05/20/15 1958 97.9 F (36.6 C)     Temp Source 05/20/15 1958 Oral     SpO2 05/20/15 1958 100 %     Weight 05/20/15 1958 135 lb (61.236 kg)     Height 05/20/15 1958  (1.651 m)     Head Cir --      Peak Flow --      Pain Score 05/20/15 1958 0     Pain Loc --      Pain Edu? --      Excl. in GC? --     Constitutional: Alert and oriented. Well appearing and in no acute distress. Eyes: Conjunctivae are normal. PERRL. EOMI. Head: Atraumatic. Nose: No congestion/rhinnorhea. Mouth/Throat: Mucous membranes are moist.  Oropharynx non-erythematous. Neck: No stridor.  No cervical spine tenderness to palpation. Hematological/Lymphatic/Immunilogical: No cervical lymphadenopathy. Cardiovascular: Normal rate, regular rhythm. Grossly normal heart sounds.  Good peripheral circulation. Respiratory: Normal respiratory effort.  No retractions. Lungs CTAB. Gastrointestinal: Soft and nontender. No distention. No abdominal bruits. No CVA  tenderness. Musculoskeletal: No weight obvious deformity ecchymosis or edema to the left hip and thigh area. Patient has decreased range of motion with flexion and a complaining of pain in the groin area. Patient also had increased guarding with abduction of the leg.  Neurologic:  Normal speech and language. No gross focal neurologic deficits are appreciated. No gait instability. Skin:  Skin is warm, dry and intact. No rash noted. Psychiatric: Mood and affect are normal. Speech and behavior are normal.  ____________________________________________   LABS (all labs ordered are listed, but only abnormal results are displayed)  Labs Reviewed - No data to display ____________________________________________  EKG   ____________________________________________  RADIOLOGY  No acute findings on x-ray of the left hip pelvic. I, Joni Reining, personally viewed and evaluated these images (plain radiographs) as part of my medical decision making, as well as reviewing the written report by the radiologist.  ____________________________________________   PROCEDURES  Procedure(s) performed: None  Critical Care performed: No  ____________________________________________   INITIAL IMPRESSION / ASSESSMENT AND PLAN / ED COURSE  Pertinent labs & imaging results that were available during my care of the patient were reviewed by me and considered in my medical decision making (see chart for details).  Left inguinal strain. This x-ray findings with patient and daughter. Patient given discharge care instructions. ____________________________________________   FINAL CLINICAL IMPRESSION(S) / ED DIAGNOSES  Final diagnoses:  Inguinal strain, initial encounter      Joni Reining, PA-C 05/20/15 2343  Jeanmarie Plant, MD 05/20/15 [phone]

## 2016-01-05 ENCOUNTER — Encounter: Payer: Self-pay | Admitting: Emergency Medicine

## 2016-01-05 ENCOUNTER — Emergency Department: Payer: Medicare Other

## 2016-01-05 ENCOUNTER — Observation Stay
Admission: EM | Admit: 2016-01-05 | Discharge: 2016-01-06 | Disposition: A | Discharge status: Home / Self Care | Payer: Medicare Other | Attending: Internal Medicine | Admitting: Internal Medicine

## 2016-01-05 DIAGNOSIS — E1165 Type 2 diabetes mellitus with hyperglycemia: Secondary | ICD-10-CM | POA: Insufficient documentation

## 2016-01-05 DIAGNOSIS — Z9889 Other specified postprocedural states: Secondary | ICD-10-CM | POA: Diagnosis not present

## 2016-01-05 DIAGNOSIS — R410 Disorientation, unspecified: Secondary | ICD-10-CM

## 2016-01-05 DIAGNOSIS — R2681 Unsteadiness on feet: Secondary | ICD-10-CM | POA: Diagnosis present

## 2016-01-05 DIAGNOSIS — R531 Weakness: Secondary | ICD-10-CM | POA: Diagnosis not present

## 2016-01-05 DIAGNOSIS — Z888 Allergy status to other drugs, medicaments and biological substances status: Secondary | ICD-10-CM | POA: Insufficient documentation

## 2016-01-05 DIAGNOSIS — R262 Difficulty in walking, not elsewhere classified: Secondary | ICD-10-CM | POA: Diagnosis present

## 2016-01-05 DIAGNOSIS — Z9049 Acquired absence of other specified parts of digestive tract: Secondary | ICD-10-CM | POA: Insufficient documentation

## 2016-01-05 DIAGNOSIS — I700 Atherosclerosis of aorta: Secondary | ICD-10-CM | POA: Diagnosis not present

## 2016-01-05 DIAGNOSIS — F17210 Nicotine dependence, cigarettes, uncomplicated: Secondary | ICD-10-CM | POA: Diagnosis not present

## 2016-01-05 DIAGNOSIS — G9341 Metabolic encephalopathy: Secondary | ICD-10-CM | POA: Insufficient documentation

## 2016-01-05 DIAGNOSIS — Z7982 Long term (current) use of aspirin: Secondary | ICD-10-CM | POA: Insufficient documentation

## 2016-01-05 DIAGNOSIS — F0390 Unspecified dementia without behavioral disturbance: Secondary | ICD-10-CM | POA: Diagnosis not present

## 2016-01-05 DIAGNOSIS — I10 Essential (primary) hypertension: Secondary | ICD-10-CM | POA: Insufficient documentation

## 2016-01-05 DIAGNOSIS — E039 Hypothyroidism, unspecified: Secondary | ICD-10-CM | POA: Diagnosis not present

## 2016-01-05 DIAGNOSIS — Z23 Encounter for immunization: Secondary | ICD-10-CM | POA: Diagnosis not present

## 2016-01-05 DIAGNOSIS — N179 Acute kidney failure, unspecified: Secondary | ICD-10-CM | POA: Diagnosis not present

## 2016-01-05 DIAGNOSIS — R197 Diarrhea, unspecified: Secondary | ICD-10-CM | POA: Insufficient documentation

## 2016-01-05 DIAGNOSIS — N390 Urinary tract infection, site not specified: Secondary | ICD-10-CM | POA: Insufficient documentation

## 2016-01-05 DIAGNOSIS — Z8249 Family history of ischemic heart disease and other diseases of the circulatory system: Secondary | ICD-10-CM | POA: Insufficient documentation

## 2016-01-05 DIAGNOSIS — E860 Dehydration: Secondary | ICD-10-CM | POA: Insufficient documentation

## 2016-01-05 LAB — URINALYSIS COMPLETE WITH MICROSCOPIC (ARMC ONLY)
Bilirubin Urine: NEGATIVE
Glucose, UA: NEGATIVE mg/dL
HGB URINE DIPSTICK: NEGATIVE
KETONES UR: NEGATIVE mg/dL
NITRITE: NEGATIVE
PH: 5 (ref 5.0–8.0)
PROTEIN: 30 mg/dL — AB
SPECIFIC GRAVITY, URINE: 1.016 (ref 1.005–1.030)

## 2016-01-05 LAB — CBC
HEMATOCRIT: 33.9 % — AB (ref 35.0–47.0)
HEMOGLOBIN: 11.7 g/dL — AB (ref 12.0–16.0)
MCH: 31.1 pg (ref 26.0–34.0)
MCHC: 34.5 g/dL (ref 32.0–36.0)
MCV: 90 fL (ref 80.0–100.0)
Platelets: 198 10*3/uL (ref 150–440)
RBC: 3.77 MIL/uL — AB (ref 3.80–5.20)
RDW: 14 % (ref 11.5–14.5)
WBC: 7 10*3/uL (ref 3.6–11.0)

## 2016-01-05 LAB — COMPREHENSIVE METABOLIC PANEL
ALBUMIN: 4 g/dL (ref 3.5–5.0)
ALK PHOS: 57 U/L (ref 38–126)
ALT: 23 U/L (ref 14–54)
ANION GAP: 7 (ref 5–15)
AST: 37 U/L (ref 15–41)
BUN: 22 mg/dL — ABNORMAL HIGH (ref 6–20)
CALCIUM: 8.8 mg/dL — AB (ref 8.9–10.3)
CO2: 25 mmol/L (ref 22–32)
Chloride: 104 mmol/L (ref 101–111)
Creatinine, Ser: 1.18 mg/dL — ABNORMAL HIGH (ref 0.44–1.00)
GFR calc non Af Amer: 40 mL/min — ABNORMAL LOW (ref >60)
GFR, EST AFRICAN AMERICAN: 47 mL/min — AB (ref >60)
Glucose, Bld: 187 mg/dL — ABNORMAL HIGH (ref 65–99)
POTASSIUM: 4.2 mmol/L (ref 3.5–5.1)
Sodium: 136 mmol/L (ref 135–145)
TOTAL PROTEIN: 6.9 g/dL (ref 6.5–8.1)

## 2016-01-05 LAB — GLUCOSE, CAPILLARY: Glucose-Capillary: 157 mg/dL — ABNORMAL HIGH (ref 65–99)

## 2016-01-05 MED ORDER — SODIUM CHLORIDE 0.9 % IV BOLUS (SEPSIS)
500.0000 mL | Freq: Once | INTRAVENOUS | Status: AC
  Administered 2016-01-06: 500 mL via INTRAVENOUS

## 2016-01-05 NOTE — ED Triage Notes (Signed)
Pt presents to ED with c/o increase in confusion and frequent cough all day today since waking. Pt daughter states pt has been coughing since Monday and yesterday had frequent diarrhea. Has a hx of dementia. Pt currently has no increased work of breathing or acute distress noted at this time. Answering questions appropriately.

## 2016-01-05 NOTE — ED Provider Notes (Signed)
[facility] Emergency Department Provider Note   ____________________________________________   First MD Initiated Contact with Patient 01/05/16 2325     (approximate)  I have reviewed the triage vital signs and the nursing notes.   HISTORY  Chief Complaint Cough and Altered Mental Status  The patient has a history of dementia and some confusion so the history is taken from the patient's daughter.  HPI Sara Delgado is a 82 y.o. female who comes into the hospital today with some altered mental status. The patient's daughter reports that she had by diarrhea couple of days ago. She reports that it was very bad. Yesterday the patient was doing well but today the patient seems very confused. The patient reports that she's been agitated but her daughter reports that she's been confused about where she is in thinking that people are coming in the house when they are not. The patient's daughter reports that the agitation comes and goes. She has periods were she is okay in the periods where she is very confused. She also was unable to get into the shower by herself when she had been able to do it the day before. She seems very weak according to the patient's daughter and the patient's husband. The patient denies any chest pain, abdominal pain, vomiting, fever. She's had a cough which concerns the daughter as well. She denies any difficulty breathing and has not been coughing anything up. The patient's daughter was unsure if she maybe had a little bit of dehydration or UTI so she decided to bring her into the hospital for evaluation.   Past Medical History:  Diagnosis Date  . Dementia   . Diabetes mellitus without complication (HCC)   . Hypertension   . Hypothyroidism     Patient Active Problem List   Diagnosis Date Noted  . Trimalleolar fracture of left ankle 01/05/2015  . Dementia 01/05/2015  . HTN (hypertension) 01/05/2015  . DM (diabetes mellitus) (HCC)  01/05/2015  . HLD (hyperlipidemia) 01/05/2015  . Hypothyroidism 01/05/2015    Past Surgical History:  Procedure Laterality Date  . APPENDECTOMY    . CHOLECYSTECTOMY    . EYE SURGERY    . MELANOMA EXCISION    . ORIF ANKLE FRACTURE Left 01/06/2015   Procedure: OPEN REDUCTION INTERNAL FIXATION (ORIF) ANKLE FRACTURE;  Surgeon: Kennedy Bucker, MD;  Location: [facility] ORS;  Service: Orthopedics;  Laterality: Left;  . TONSILLECTOMY      Prior to Admission medications   Medication Sig Start Date End Date Taking? Authorizing Provider  amLODipine (NORVASC) 10 MG tablet Take 10 mg by mouth daily.    Historical Provider, MD  aspirin 81 MG tablet Take 81 mg by mouth daily.    Historical Provider, MD  atorvastatin (LIPITOR) 10 MG tablet Take 10 mg by mouth daily.    Historical Provider, MD  calcium-vitamin D (OSCAL WITH D) 500-200 MG-UNIT per tablet Take 2 tablets by mouth 2 (two) times daily. With meals    Historical Provider, MD  cholestyramine Lanetta Inch) 4 G packet Take 4 g by mouth 2 (two) times daily.    Historical Provider, MD  citalopram (CELEXA) 20 MG tablet Take 20 mg by mouth daily.    Historical Provider, MD  co-enzyme Q-10 30 MG capsule Take 100 mg by mouth daily.    Historical Provider, MD  docusate sodium (COLACE) 100 MG capsule Take 1 capsule (100 mg total) by mouth 2 (two) times daily. 01/09/15   Enedina Finner, MD  donepezil (  ARICEPT) 10 MG tablet Take 10 mg by mouth at bedtime.    Historical Provider, MD  HYDROcodone-acetaminophen (NORCO/VICODIN) 5-325 MG per tablet Take 1-2 tablets by mouth every 4 (four) hours as needed (breakthrough pain). 01/08/15   Evon Slack, PA-C  ketorolac (TORADOL) 10 MG tablet Take 1 tablet (10 mg total) by mouth every 6 (six) hours as needed. 05/20/15   Joni Reining, PA-C  levothyroxine (SYNTHROID, LEVOTHROID) 125 MCG tablet Take 125 mcg by mouth daily before breakfast. 6 am 01/06/15   Historical Provider, MD  loperamide (IMODIUM A-D) 2 MG tablet Take 2 mg by  mouth 4 (four) times daily as needed for diarrhea or loose stools.    Historical Provider, MD  losartan (COZAAR) 100 MG tablet Take 100 mg by mouth daily.    Historical Provider, MD  magnesium oxide (MAG-OX) 400 MG tablet Take 400 mg by mouth daily.    Historical Provider, MD  memantine (NAMENDA) 5 MG tablet Take 5 mg by mouth.    Historical Provider, MD  Multiple Vitamins-Minerals (MULTIVITAMIN WITH MINERALS) tablet Take 1 tablet by mouth daily.    Historical Provider, MD  omeprazole (PRILOSEC) 20 MG capsule Take 20 mg by mouth daily.    Historical Provider, MD  traMADol (ULTRAM) 50 MG tablet Take 1 tablet (50 mg total) by mouth every 12 (twelve) hours as needed for moderate pain. 05/20/15   Joni Reining, PA-C    Allergies Atorvastatin and Metformin and related  Family History  Problem Relation Age of Onset  . CAD Mother   . CAD Father   . Dementia Sister   . CAD Brother   . Cancer Brother     Social History Social History  Substance Use Topics  . Smoking status: Never Smoker  . Smokeless tobacco: Never Used  . Alcohol use No    Review of Systems Constitutional: No fever/chills Eyes: No visual changes. ENT: No sore throat. Cardiovascular: Denies chest pain. Respiratory: Denies shortness of breath. Gastrointestinal: Diarrhea with No abdominal pain.  No nausea, no vomiting.   Genitourinary: Negative for dysuria. Musculoskeletal: Negative for back pain. Skin: Negative for rash. Neurological: Confusion  10-point ROS otherwise negative.  ____________________________________________   PHYSICAL EXAM:  VITAL SIGNS: ED Triage Vitals  Enc Vitals Group     BP 01/05/16 1913 (!) 122/50     Pulse Rate 01/05/16 1913 65     Resp 01/05/16 1913 16     Temp 01/05/16 1913 98.4 F (36.9 C)     Temp Source 01/05/16 1913 Oral     SpO2 01/05/16 1913 97 %     Weight 01/05/16 1914 163 lb (73.9 kg)     Height 01/05/16 1914 5\' 6"  (1.676 m)     Head Circumference --      Peak Flow  --      Pain Score 01/05/16 1913 0     Pain Loc --      Pain Edu? --      Excl. in GC? --     Constitutional: Alert and oriented To self, patient thinks it is January although she knows it's Friday, patient knows she is in a hospital but unsure which hospital.. Well appearing and in Mild distress. Eyes: Conjunctivae are normal. PERRL. EOMI. Head: Atraumatic. Nose: No congestion/rhinnorhea. Mouth/Throat: Mucous membranes are moist.  Oropharynx non-erythematous. Cardiovascular: Normal rate, regular rhythm. Grossly normal heart sounds.  Good peripheral circulation. Respiratory: Normal respiratory effort.  No retractions. Lungs CTAB. Gastrointestinal: Soft and nontender.  No distention. Positive bowel sounds Musculoskeletal: No lower extremity tenderness nor edema.   Neurologic:  Normal speech and language. Cranial nerves II through XII are grossly intact with no focal motor or neuro deficits Skin:  Skin is warm, dry and intact. No rash noted. Psychiatric: Mood and affect are normal. Speech and behavior are normal.  ____________________________________________   LABS (all labs ordered are listed, but only abnormal results are displayed)  Labs Reviewed  COMPREHENSIVE METABOLIC PANEL - Abnormal; Notable for the following:       Result Value   Glucose, Bld 187 (*)    BUN 22 (*)    Creatinine, Ser 1.18 (*)    Calcium 8.8 (*)    Total Bilirubin <0.1 (*)    GFR calc non Af Amer 40 (*)    GFR calc Af Amer 47 (*)    All other components within normal limits  CBC - Abnormal; Notable for the following:    RBC 3.77 (*)    Hemoglobin 11.7 (*)    HCT 33.9 (*)    All other components within normal limits  URINALYSIS COMPLETEWITH MICROSCOPIC ([facility] ONLY) - Abnormal; Notable for the following:    Color, Urine YELLOW (*)    APPearance HAZY (*)    Protein, ur 30 (*)    Leukocytes, UA 1+ (*)    Bacteria, UA RARE (*)    Squamous Epithelial / LPF 0-5 (*)    All other components within normal  limits  GLUCOSE, CAPILLARY - Abnormal; Notable for the following:    Glucose-Capillary 157 (*)    All other components within normal limits  LACTIC ACID, PLASMA  TROPONIN I  LACTIC ACID, PLASMA  CBG MONITORING, ED   ____________________________________________  EKG  ED ECG REPORT I, Rebecka Apley, the attending physician, personally viewed and interpreted this ECG.   Date: 01/05/2016  EKG Time: 1957  Rate: 65  Rhythm: normal sinus rhythm  Axis: normal  Intervals:none  ST&T Change: none  ____________________________________________  RADIOLOGY  CT head Chest x-ray ____________________________________________   PROCEDURES  Procedure(s) performed: None  Procedures  Critical Care performed: No  ____________________________________________   INITIAL IMPRESSION / ASSESSMENT AND PLAN / ED COURSE  Pertinent labs & imaging results that were available during my care of the patient were reviewed by me and considered in my medical decision making (see chart for details).  This is an 82 year old female who comes into the hospital today with some confusion and a cough. The patient did have some diarrhea a few days ago and the patient's daughter is unsure what may be the cause of her symptoms. I will give the patient a 500 mL bolus of normal saline and she does have a mildly elevated creatinine. I will check a CT scan as well as a chest x-ray. The patient's blood work at this time is unremarkable I will also check a troponin and lactic acid. The patient will be reassessed once received the results of her imaging studies.  Clinical Course  Value Comment By Time  DG Chest 2 View 1. Mild bronchial thickening. 2. Thoracic aortic atherosclerosis.   Rebecka Apley, MD 09/16 0026  CT Head Wo Contrast 1. No acute intracranial process. 2. Mild age-related cerebral atrophy with chronic microvascular ischemic disease.   Rebecka Apley, MD 09/16 0105   The patient's  imaging and workup is unremarkable. I did have the nurse attempt to ambulate the patient and the patient was very unsteady on her feet with  a shuffling gait as well as being off balance. Given the patient's inability to be safe at home she'll be admitted to the hospital for further evaluation.  ____________________________________________   FINAL CLINICAL IMPRESSION(S) / ED DIAGNOSES  Final diagnoses:  Confusion  Difficulty walking      NEW MEDICATIONS STARTED DURING THIS VISIT:  New Prescriptions   No medications on file     Note:  This document was prepared using Dragon voice recognition software and may include unintentional dictation errors.    Rebecka ApleyAllison P Kalven Ganim, MD 01/06/16 [phone]

## 2016-01-05 NOTE — ED Notes (Signed)
Patient denies pain and is resting comfortably.  

## 2016-01-06 ENCOUNTER — Encounter: Payer: Self-pay | Admitting: Internal Medicine

## 2016-01-06 DIAGNOSIS — R2681 Unsteadiness on feet: Secondary | ICD-10-CM | POA: Diagnosis present

## 2016-01-06 LAB — CBC
HEMATOCRIT: 31.6 % — AB (ref 35.0–47.0)
HEMOGLOBIN: 10.9 g/dL — AB (ref 12.0–16.0)
MCH: 31.1 pg (ref 26.0–34.0)
MCHC: 34.5 g/dL (ref 32.0–36.0)
MCV: 90 fL (ref 80.0–100.0)
Platelets: 188 10*3/uL (ref 150–440)
RBC: 3.51 MIL/uL — ABNORMAL LOW (ref 3.80–5.20)
RDW: 14 % (ref 11.5–14.5)
WBC: 6.2 10*3/uL (ref 3.6–11.0)

## 2016-01-06 LAB — BASIC METABOLIC PANEL
ANION GAP: 5 (ref 5–15)
BUN: 18 mg/dL (ref 6–20)
CALCIUM: 8.5 mg/dL — AB (ref 8.9–10.3)
CHLORIDE: 108 mmol/L (ref 101–111)
CO2: 26 mmol/L (ref 22–32)
CREATININE: 1.06 mg/dL — AB (ref 0.44–1.00)
GFR calc non Af Amer: 46 mL/min — ABNORMAL LOW (ref >60)
GFR, EST AFRICAN AMERICAN: 53 mL/min — AB (ref >60)
Glucose, Bld: 90 mg/dL (ref 65–99)
Potassium: 4 mmol/L (ref 3.5–5.1)
SODIUM: 139 mmol/L (ref 135–145)

## 2016-01-06 LAB — LACTIC ACID, PLASMA
LACTIC ACID, VENOUS: 0.5 mmol/L (ref 0.5–1.9)
LACTIC ACID, VENOUS: 0.8 mmol/L (ref 0.5–1.9)

## 2016-01-06 LAB — GLUCOSE, CAPILLARY
GLUCOSE-CAPILLARY: 83 mg/dL (ref 65–99)
GLUCOSE-CAPILLARY: 93 mg/dL (ref 65–99)

## 2016-01-06 LAB — TROPONIN I

## 2016-01-06 MED ORDER — ENOXAPARIN SODIUM 40 MG/0.4ML ~~LOC~~ SOLN
40.0000 mg | Freq: Every day | SUBCUTANEOUS | Status: DC
  Administered 2016-01-06: 40 mg via SUBCUTANEOUS
  Filled 2016-01-06: qty 0.4

## 2016-01-06 MED ORDER — SODIUM CHLORIDE 0.9 % IV SOLN
INTRAVENOUS | Status: DC
  Administered 2016-01-06: 05:00:00 via INTRAVENOUS

## 2016-01-06 MED ORDER — CHOLESTYRAMINE 4 G PO PACK
4.0000 g | PACK | Freq: Two times a day (BID) | ORAL | Status: DC
  Administered 2016-01-06: 4 g via ORAL
  Filled 2016-01-06: qty 1

## 2016-01-06 MED ORDER — PANTOPRAZOLE SODIUM 40 MG PO TBEC
40.0000 mg | DELAYED_RELEASE_TABLET | Freq: Every day | ORAL | Status: DC
  Administered 2016-01-06: 40 mg via ORAL
  Filled 2016-01-06: qty 1

## 2016-01-06 MED ORDER — ONDANSETRON HCL 4 MG/2ML IJ SOLN: 4.0000 mg | Freq: Four times a day (QID) | INTRAMUSCULAR | Status: DC | PRN

## 2016-01-06 MED ORDER — LOSARTAN POTASSIUM 50 MG PO TABS
100.0000 mg | ORAL_TABLET | Freq: Every day | ORAL | Status: DC
  Administered 2016-01-06: 100 mg via ORAL
  Filled 2016-01-06: qty 2

## 2016-01-06 MED ORDER — LOPERAMIDE HCL 2 MG PO CAPS: 2.0000 mg | ORAL_CAPSULE | Freq: Four times a day (QID) | ORAL | Status: DC | PRN

## 2016-01-06 MED ORDER — ACETAMINOPHEN 650 MG RE SUPP: 650.0000 mg | Freq: Four times a day (QID) | RECTAL | Status: DC | PRN

## 2016-01-06 MED ORDER — DONEPEZIL HCL 5 MG PO TABS: 10.0000 mg | ORAL_TABLET | Freq: Every day | ORAL | Status: DC

## 2016-01-06 MED ORDER — INSULIN ASPART 100 UNIT/ML ~~LOC~~ SOLN: 4.0000 [IU] | Freq: Three times a day (TID) | SUBCUTANEOUS | Status: DC

## 2016-01-06 MED ORDER — CALCIUM CARBONATE-VITAMIN D 500-200 MG-UNIT PO TABS
2.0000 | ORAL_TABLET | Freq: Two times a day (BID) | ORAL | Status: DC
  Administered 2016-01-06: 2 via ORAL
  Filled 2016-01-06: qty 2

## 2016-01-06 MED ORDER — AMLODIPINE BESYLATE 10 MG PO TABS
10.0000 mg | ORAL_TABLET | Freq: Every day | ORAL | Status: DC
Start: 2016-01-06 — End: 2016-01-06
  Administered 2016-01-06: 10 mg via ORAL
  Filled 2016-01-06: qty 1

## 2016-01-06 MED ORDER — CEFUROXIME AXETIL 500 MG PO TABS: 500.0000 mg | ORAL_TABLET | Freq: Two times a day (BID) | ORAL | 0 refills | Status: AC

## 2016-01-06 MED ORDER — SENNOSIDES-DOCUSATE SODIUM 8.6-50 MG PO TABS: 1.0000 | ORAL_TABLET | Freq: Every evening | ORAL | Status: DC | PRN

## 2016-01-06 MED ORDER — COENZYME Q10 30 MG PO CAPS: 100.0000 mg | ORAL_CAPSULE | Freq: Every day | ORAL | Status: DC

## 2016-01-06 MED ORDER — ATORVASTATIN CALCIUM 20 MG PO TABS
10.0000 mg | ORAL_TABLET | Freq: Every day | ORAL | Status: DC
Start: 2016-01-06 — End: 2016-01-06
  Administered 2016-01-06: 10 mg via ORAL
  Filled 2016-01-06: qty 1

## 2016-01-06 MED ORDER — INFLUENZA VAC SPLIT QUAD 0.5 ML IM SUSY
0.5000 mL | PREFILLED_SYRINGE | Freq: Once | INTRAMUSCULAR | Status: AC
  Administered 2016-01-06: 0.5 mL via INTRAMUSCULAR
  Filled 2016-01-06: qty 0.5

## 2016-01-06 MED ORDER — MAGNESIUM OXIDE 400 (241.3 MG) MG PO TABS
400.0000 mg | ORAL_TABLET | Freq: Every day | ORAL | Status: DC
  Administered 2016-01-06: 400 mg via ORAL
  Filled 2016-01-06: qty 1

## 2016-01-06 MED ORDER — ACETAMINOPHEN 325 MG PO TABS: 650.0000 mg | ORAL_TABLET | Freq: Four times a day (QID) | ORAL | Status: DC | PRN

## 2016-01-06 MED ORDER — ADULT MULTIVITAMIN W/MINERALS CH
1.0000 | ORAL_TABLET | Freq: Every day | ORAL | Status: DC
  Administered 2016-01-06: 1 via ORAL
  Filled 2016-01-06: qty 1

## 2016-01-06 MED ORDER — CITALOPRAM HYDROBROMIDE 20 MG PO TABS
20.0000 mg | ORAL_TABLET | Freq: Every day | ORAL | Status: DC
  Administered 2016-01-06: 20 mg via ORAL
  Filled 2016-01-06: qty 1

## 2016-01-06 MED ORDER — LEVOTHYROXINE SODIUM 125 MCG PO TABS
125.0000 ug | ORAL_TABLET | Freq: Every day | ORAL | Status: DC
  Administered 2016-01-06: 125 ug via ORAL
  Filled 2016-01-06: qty 1

## 2016-01-06 MED ORDER — INSULIN ASPART 100 UNIT/ML ~~LOC~~ SOLN: 0.0000 [IU] | Freq: Every day | SUBCUTANEOUS | Status: DC

## 2016-01-06 MED ORDER — ONDANSETRON HCL 4 MG PO TABS: 4.0000 mg | ORAL_TABLET | Freq: Four times a day (QID) | ORAL | Status: DC | PRN

## 2016-01-06 MED ORDER — DEXTROSE 5 % IV SOLN
1.0000 g | INTRAVENOUS | Status: DC
  Administered 2016-01-06: 1 g via INTRAVENOUS
  Filled 2016-01-06: qty 10

## 2016-01-06 MED ORDER — INFLUENZA VAC SPLIT QUAD 0.5 ML IM SUSY
0.5000 mL | PREFILLED_SYRINGE | INTRAMUSCULAR | Status: DC
Start: 2016-01-07 — End: 2016-01-06

## 2016-01-06 MED ORDER — MEMANTINE HCL 10 MG PO TABS
5.0000 mg | ORAL_TABLET | Freq: Every day | ORAL | Status: DC
  Administered 2016-01-06: 5 mg via ORAL
  Filled 2016-01-06: qty 1

## 2016-01-06 MED ORDER — ASPIRIN EC 81 MG PO TBEC
81.0000 mg | DELAYED_RELEASE_TABLET | Freq: Every day | ORAL | Status: DC
  Administered 2016-01-06: 81 mg via ORAL
  Filled 2016-01-06: qty 1

## 2016-01-06 MED ORDER — INSULIN ASPART 100 UNIT/ML ~~LOC~~ SOLN: 0.0000 [IU] | Freq: Three times a day (TID) | SUBCUTANEOUS | Status: DC

## 2016-01-06 NOTE — Discharge Summary (Signed)
[facility] [place] [facility]   PATIENT NAME: Sara Delgado    MR#:  [id number]  DATE OF BIRTH:  1926/09/22  DATE OF ADMISSION:  01/05/2016 ADMITTING PHYSICIAN: Ihor Austin, MD  DATE OF DISCHARGE: 01/06/2016  PRIMARY CARE PHYSICIAN: Patrice Paradise, MD    ADMISSION DIAGNOSIS:  Confusion [R41.0] Difficulty walking [R26.2]  DISCHARGE DIAGNOSIS:  Principal Problem:   Gait instability   SECONDARY DIAGNOSIS:   Past Medical History:  Diagnosis Date  . Dementia   . Diabetes mellitus without complication (HCC)   . Hypertension   . Hypothyroidism     HOSPITAL COURSE:   82 year old female with a history of hypothyroidism and dementia who presents with metabolic encephalopathy and generalized weakness.  1. Metabolic encephalopathy in the setting of urinary tract infection and dehydration with acute kidney injury. This is resolved.  2. Acute kidney injury: Creatinine has improved with IV fluids. Acute kidney injury is thought to be due to diarrhea.  3. Urinary tract infection: Patient is started on Rocephin and will  discharge on Ceftin.  4. Dementia: She will continue Aricept and Namenda.  5. Essential hypertension: Continue Cozaar.  6. Hypothyroid: Continue Synthroid.  7. Diarrhea: This is resolved.  8. Weakness, generalized: Physical therapy is recommending home health which ws set up by case management.   DISCHARGE CONDITIONS AND DIET:   stable Regular diet  CONSULTS OBTAINED:    DRUG ALLERGIES:   Allergies  Allergen Reactions  . Atorvastatin Swelling    Swelling at higher doses  . Metformin And Related     DISCHARGE MEDICATIONS:   Current Discharge Medication List    START taking these medications   Details  cefUROXime (CEFTIN) 500 MG tablet Take 1 tablet (500 mg total) by mouth 2 (two) times daily with a meal. Qty: 12 tablet, Refills: 0      CONTINUE these medications which have NOT CHANGED   Details   amLODipine (NORVASC) 10 MG tablet Take 10 mg by mouth daily.    aspirin 81 MG tablet Take 81 mg by mouth daily.    atorvastatin (LIPITOR) 10 MG tablet Take 10 mg by mouth daily.    calcium-vitamin D (OSCAL WITH D) 500-200 MG-UNIT per tablet Take 2 tablets by mouth 2 (two) times daily. With meals    cholestyramine (QUESTRAN) 4 G packet Take 4 g by mouth 2 (two) times daily.    citalopram (CELEXA) 20 MG tablet Take 20 mg by mouth daily.    co-enzyme Q-10 30 MG capsule Take 100 mg by mouth daily.    donepezil (ARICEPT) 10 MG tablet Take 10 mg by mouth at bedtime.    levothyroxine (SYNTHROID, LEVOTHROID) 125 MCG tablet Take 125 mcg by mouth daily before breakfast. 6 am    loperamide (IMODIUM A-D) 2 MG tablet Take 2 mg by mouth 4 (four) times daily as needed for diarrhea or loose stools.    losartan (COZAAR) 100 MG tablet Take 100 mg by mouth daily.    magnesium oxide (MAG-OX) 400 MG tablet Take 400 mg by mouth daily.    memantine (NAMENDA) 5 MG tablet Take 5 mg by mouth.    Multiple Vitamins-Minerals (MULTIVITAMIN WITH MINERALS) tablet Take 1 tablet by mouth daily.    omeprazole (PRILOSEC) 20 MG capsule Take 20 mg by mouth daily.              Today   CHIEF COMPLAINT:  No issues daughter at beside   VITAL SIGNS:  Blood pressure Marland Kitchen)  122/56, pulse 66, temperature 98.6 F (37 C), temperature source Oral, resp. rate 16, height 5\' 6"  (1.676 m), weight 73.9 kg (163 lb), SpO2 97 %.   REVIEW OF SYSTEMS:  Review of Systems  Constitutional: Negative for chills, fever and malaise/fatigue.  HENT: Negative.  Negative for ear discharge, ear pain, hearing loss, nosebleeds and sore throat.   Eyes: Negative.  Negative for blurred vision and pain.  Respiratory: Negative.  Negative for cough, hemoptysis, shortness of breath and wheezing.   Cardiovascular: Negative.  Negative for chest pain, palpitations and leg swelling.  Gastrointestinal: Negative.  Negative for abdominal pain,  blood in stool, diarrhea, nausea and vomiting.  Genitourinary: Negative.  Negative for dysuria.  Musculoskeletal: Negative.  Negative for back pain.  Skin: Negative.   Neurological: Positive for weakness. Negative for dizziness, tremors, speech change, focal weakness, seizures and headaches.  Endo/Heme/Allergies: Negative.  Does not bruise/bleed easily.  Psychiatric/Behavioral: Positive for memory loss. Negative for depression, hallucinations and suicidal ideas.     PHYSICAL EXAMINATION:  GENERAL:  82 y.o.-year-old patient lying in the bed with no acute distress.  NECK:  Supple, no jugular venous distention. No thyroid enlargement, no tenderness.  LUNGS: Normal breath sounds bilaterally, no wheezing, rales,rhonchi  No use of accessory muscles of respiration.  CARDIOVASCULAR: S1, S2 normal. No murmurs, rubs, or gallops.  ABDOMEN: Soft, non-tender, non-distended. Bowel sounds present. No organomegaly or mass.  EXTREMITIES: No pedal edema, cyanosis, or clubbing.  PSYCHIATRIC: The patient is alert and oriented x name and place  SKIN: No obvious rash, lesion, or ulcer. No neurological deficits   DATA REVIEW:   CBC  Recent Labs Lab 01/06/16 0543  WBC 6.2  HGB 10.9*  HCT 31.6*  PLT 188    Chemistries   Recent Labs Lab 01/05/16 1941 01/06/16 0543  NA 136 139  K 4.2 4.0  CL 104 108  CO2 25 26  GLUCOSE 187* 90  BUN 22* 18  CREATININE 1.18* 1.06*  CALCIUM 8.8* 8.5*  AST 37  --   ALT 23  --   ALKPHOS 57  --   BILITOT <0.1*  --     Cardiac Enzymes  Recent Labs Lab 01/05/16 1941  TROPONINI <0.03    Microbiology Results  @[email or web address] @  RADIOLOGY:  Dg Chest 2 View  Result Date: 01/06/2016 CLINICAL DATA:  Cough for 2 days.  Altered mental status. EXAM: CHEST  2 VIEW COMPARISON:  04/24/2014 FINDINGS: The cardiomediastinal contours are normal. There is atherosclerosis of the thoracic aorta. Mild bronchial thickening. No consolidation, pleural effusion, or  pneumothorax. No acute osseous abnormalities are seen. Chronic degenerative change of both shoulders. IMPRESSION: 1. Mild bronchial thickening. 2. Thoracic aortic atherosclerosis. Electronically Signed   By: Rubye OaksMelanie  Ehinger M.D.   On: 01/06/2016 00:18   Ct Head Wo Contrast  Result Date: 01/06/2016 CLINICAL DATA:  Initial evaluation for increased adhesion. EXAM: CT HEAD WITHOUT CONTRAST TECHNIQUE: Contiguous axial images were obtained from the base of the skull through the vertex without intravenous contrast. COMPARISON:  Prior CT from 01/05/2015. FINDINGS: Brain: Mild age-related cerebral atrophy with chronic microvascular ischemic disease. No acute intracranial hemorrhage. No evidence for acute large vessel territory infarct. No mass lesion, midline shift or mass effect. Ventricular prominence related to global parenchymal volume loss without hydrocephalus. No extra-axial fluid collection. Vascular: No hyperdense vessel. Scattered vascular calcifications within the carotid siphons. Skull: Scalp soft tissues demonstrate no acute abnormality. Calvarium intact. Sinuses/Orbits: Globes and orbits within normal limits. Paranasal sinuses are clear. No  mastoid effusion. Other: No other significant finding. IMPRESSION: 1. No acute intracranial process. 2. Mild age-related cerebral atrophy with chronic microvascular ischemic disease. Electronically Signed   By: Rise Mu M.D.   On: 01/06/2016 00:32      Management plans discussed with the patient and daughter and she is in agreement. Stable for discharge   Patient should follow up with pcp  CODE STATUS:     Code Status Orders        Start     Ordered   01/06/16 0446  Full code  Continuous     01/06/16 0446    Code Status History    Date Active Date Inactive Code Status Order ID Comments User Context   01/06/2015  4:07 PM 01/09/2015  5:02 PM Full Code [id number]  Kennedy Bucker, MD Inpatient   01/06/2015  1:49 AM 01/06/2015  4:07 PM Full Code  [id number]  Crissie Figures, MD Inpatient    Advance Directive Documentation   Flowsheet Row Most Recent Value  Type of Advance Directive  Healthcare Power of Attorney, Living will  Pre-existing out of facility DNR order (yellow form or pink MOST form)  No data  "MOST" Form in Place?  No data      TOTAL TIME TAKING CARE OF THIS PATIENT: 35 minutes.    Note: This dictation was prepared with Dragon dictation along with smaller phrase technology. Any transcriptional errors that result from this process are unintentional.  Beatrice Ziehm M.D on 01/06/2016 at 11:57 AM  Between 7am to 6pm - Pager - [phone] After 6pm go to [email or web address] - Social research officer, government  [facility]  Office  [phone]  CC: Primary care physician; Patrice Paradise, MD

## 2016-01-06 NOTE — Progress Notes (Signed)
PHARMACIST - PHYSICIAN ORDER COMMUNICATION  CONCERNING: P&T Medication Policy on Herbal Medications  DESCRIPTION:  This patient's order for:  Co Enzyme Q10  has been noted.  This product(s) is classified as an "herbal" or natural product. Due to a lack of definitive safety studies or FDA approval, nonstandard manufacturing practices, plus the potential risk of unknown drug-drug interactions while on inpatient medications, the Pharmacy and Therapeutics Committee does not permit the use of "herbal" or natural products of this type within [facility].   ACTION TAKEN: The pharmacy department is unable to verify this order at this time Please reevaluate patient's clinical condition at discharge and address if the herbal or natural product(s) should be resumed at that time.   

## 2016-01-06 NOTE — Progress Notes (Signed)
Pharmacy Antibiotic Note  Sara DuvalHilda S Turrubiates is a 82 y.o. female admitted on 01/05/2016 with UTI.  Pharmacy has been consulted for ceftriaxone dosing.  Plan: Ceftriaxone 1 g IV q24h  Height: 5\' 6"  (167.6 cm) Weight: 163 lb (73.9 kg) IBW/kg (Calculated) : 59.3  Temp (24hrs), Avg:98.5 F (36.9 C), Min:98.4 F (36.9 C), Max:98.6 F (37 C)   Recent Labs Lab 01/05/16 1941 01/06/16 0036 01/06/16 0542 01/06/16 0543  WBC 7.0  --   --  6.2  CREATININE 1.18*  --   --  1.06*  LATICACIDVEN  --  0.8 0.5  --     Estimated Creatinine Clearance: 38.4 mL/min (by C-G formula based on SCr of 1.06 mg/dL (H)).    Allergies  Allergen Reactions  . Atorvastatin Swelling    Swelling at higher doses  . Metformin And Related    Antimicrobials this admission: ceftriaxone 9/16 >>   Dose adjustments this admission:  Micro: 9/16 UCx: Ordered    Thank you for allowing pharmacy to be a part of this patient's care.  Cindi CarbonMary M Anita Laguna, PharmD 01/06/2016 9:41 AM

## 2016-01-06 NOTE — Care Management Note (Signed)
Case Management Note  Patient Details  Name: Sara DuvalHilda S Delgado MRN: [id number] Date of Birth: 08/13/1926  Subjective/Objective:     Per daughter Valorie's request, this writer [facility] hosp[facility] services in general [facility] via her cell phone [phone].                Action/Plan:   Expected Discharge Date:                  Expected Discharge Plan:     In-House Referral:     Discharge planning Services     Post Acute Care Choice:    Choice offered to:     DME Arranged:    DME Agency:     HH Arranged:    HH Agency:     Status of Service:     If discussed at [employer] Length of Stay Meetings, dates discussed:    Additional Comments:  Fifi Schindler A, RN 01/06/2016, 3:38 PM

## 2016-01-06 NOTE — Progress Notes (Signed)
Physical Therapy Evaluation Patient Details Name: Sara DuvalHilda S Delgado MRN: [id number] DOB: 07/29/1926 Today's Date: 01/06/2016   History of Present Illness  Patient is an 82 y.o. female who presented to ED on 16 Sep. 2017 after experiencing increased confusion and gait imbalances with reported fall. PMH includes dementia, DMII, HTN, and hypothyroidism.  Clinical Impression  Patient is a pleasant 82 y.o. Female with history of falls, reported by patient's daughter at bedside. Patient lives with her 80 y.o. Husband and has rollator at home but does not use it to walk household distances. Upon evaluation, patient demonstrates decreased standing static and dynamic balance, showing posterior lean and knee buckle. Able to improve gait steadiness with dynamic balance activities at bedside. Still demonstrates decreased dynamic balance in gait, requiring minimal assistance. Because patient's husband is unable to assist with transfers/gait/bathing, it is believed that she will need 24 hour assistance and HHPT until she returns to her PLOF. It has been recommended that she receive a RW as well as grab bars to improve safety in her home. While in the hospital, patient will continue to benefit from progressive dynamic balance and gait training to improve function and prevent falls in the future.    Follow Up Recommendations Home health PT;Supervision/Assistance - 24 hour    Equipment Recommendations  Rolling walker with 5" wheels;Other (comment) (Grab bars)    Recommendations for Other Services       Precautions / Restrictions Precautions Precautions: Fall Restrictions Weight Bearing Restrictions: No      Mobility  Bed Mobility Overal bed mobility: Needs Assistance Bed Mobility: Supine to Sit;Sit to Supine     Supine to sit: Min assist Sit to supine: Min assist   General bed mobility comments: Patient requires minimal assistance for supine to sit and sit to supine transfers. Able to bridge through  LEs and [facility]  Transfers Overall transfer level: Needs assistance Equipment used: Rolling walker (2 wheeled) Transfers: Sit to/from Stand Sit to Stand: Mod assist         General transfer comment: Upon standing, patient demonstrates posterior lean and knees buckling. After standing weightshifting exercises, patient able to stand with B UE supported.  Ambulation/Gait Ambulation/Gait assistance: Min assist Ambulation Distance (Feet): 20 Feet Assistive device: Rolling walker (2 wheeled)       General Gait Details: Patient ambulates at decreased cadence, demonstrating shuffle pattern. Responds to verbal cues to take bigger steps with improved steadiness.  Stairs            Wheelchair Mobility    Modified Rankin (Stroke Patients Only)       Balance Overall balance assessment: Needs assistance;History of Falls Sitting-balance support: Feet supported Sitting balance-Leahy Scale: Good     Standing balance support: Bilateral upper extremity supported Standing balance-Leahy Scale: Fair                               Pertinent Vitals/Pain Pain Assessment: No/denies pain    Home Living Family/patient expects to be discharged to:: Private residence Living Arrangements: Spouse/significant other Available Help at Discharge: Family;Available 24 hours/day;Available PRN/intermittently Type of Home: House Home Access: Stairs to enter Entrance Stairs-Rails: Can reach both Entrance Stairs-Number of Steps: 5 Home Layout: One level Home Equipment: Walker - 4 wheels;Shower seat;Bedside commode      Prior Function Level of Independence: Needs assistance   Gait / Transfers Assistance Needed: Patient reportedly ModI for household distances. History of falls with sustained  injuries (ankle fx). Has rollator but does not use it.  ADL's / Homemaking Assistance Needed: Performed by husband        Hand Dominance        Extremity/Trunk Assessment   Upper  Extremity Assessment: Generalized weakness           Lower Extremity Assessment: Generalized weakness         Communication   Communication: No difficulties  Cognition Arousal/Alertness: Awake/alert Behavior During Therapy: WFL for tasks assessed/performed Overall Cognitive Status: History of cognitive impairments - at baseline       Memory: Decreased short-term memory              General Comments      Exercises     Assessment/Plan    PT Assessment Patient needs continued PT services  PT Problem List Decreased strength;Decreased activity tolerance;Decreased balance;Decreased mobility;Decreased cognition;Decreased knowledge of use of DME;Decreased safety awareness          PT Treatment Interventions DME instruction;Gait training;Stair training;Functional mobility training;Therapeutic activities;Therapeutic exercise;Balance training;Cognitive remediation;Patient/family education    PT Goals (Current goals can be found in the Care Plan section)  Acute Rehab PT Goals Patient Stated Goal: "To go home" PT Goal Formulation: With patient/family Time For Goal Achievement: 01/20/16 Potential to Achieve Goals: Good    Frequency Min 2X/week   Barriers to discharge Decreased caregiver support      Co-evaluation               End of Session Equipment Utilized During Treatment: Gait belt Activity Tolerance: Patient tolerated treatment well Patient left: in bed;with call bell/phone within reach;with bed alarm set;with family/visitor present;with SCD's reapplied Nurse Communication: Mobility status    Functional Limitation: Mobility: Walking and moving around Mobility: Walking and Moving Around Current Status (Q0347): At least 60 percent but less than 80 percent impaired, limited or restricted Mobility: Walking and Moving Around Goal Status [phone]): At least 20 percent but less than 40 percent impaired, limited or restricted    Time: 6387-5643 PT Time  Calculation (min) (ACUTE ONLY): 28 min   Charges:   PT Evaluation $PT Eval Low Complexity: 1 Procedure     PT G Codes:   PT G-Codes **NOT FOR INPATIENT CLASS** Functional Limitation: Mobility: Walking and moving around Mobility: Walking and Moving Around Current Status (P2951): At least 60 percent but less than 80 percent impaired, limited or restricted Mobility: Walking and Moving Around Goal Status [phone]): At least 20 percent but less than 40 percent impaired, limited or restricted    Neita Carp, PT, DPT 01/06/2016, 12:25 PM

## 2016-01-06 NOTE — Care Management Note (Signed)
Case Management Note  Patient Details  Name: Sara Delgado MRN: [id number] Date of Birth: 01-14-1927  Subjective/Objective:     [facility], Nurse Aide. Requested that Sara Delgado's daughter Sara Delgado [phone] be contacted to schedule appointments and with any home health concerns. [facility] services provider.                Action/Plan:   Expected Discharge Date:                  Expected Discharge Plan:     In-House Referral:     Discharge planning Services     Post Acute Care Choice:    Choice offered to:     DME Arranged:    DME Agency:     HH Arranged:    HH Agency:     Status of Service:     If discussed at [employer] Length of Stay Meetings, dates discussed:    Additional Comments:  Sara Mclees A, RN 01/06/2016, 12:31 PM

## 2016-01-06 NOTE — Progress Notes (Signed)
[facility] [place] [facility]   PATIENT NAME: Sara CrandallHilda Delgado    MR#:  [id number]  DATE OF BIRTH:  1926/10/07  SUBJECTIVE:   Patient here with weakness after diarrhea. She is also having confusion since that time. Daughter at bedside.   REVIEW OF SYSTEMS:    Review of Systems  Constitutional: Negative for chills, fever and malaise/fatigue.  HENT: Negative.  Negative for ear discharge, ear pain, hearing loss, nosebleeds and sore throat.   Eyes: Negative.  Negative for blurred vision and pain.  Respiratory: Negative.  Negative for cough, hemoptysis, shortness of breath and wheezing.   Cardiovascular: Negative.  Negative for chest pain, palpitations and leg swelling.  Gastrointestinal: Negative.  Negative for abdominal pain, blood in stool, diarrhea, nausea and vomiting.  Genitourinary: Negative.  Negative for dysuria.  Musculoskeletal: Negative.  Negative for back pain.  Skin: Negative.   Neurological: Positive for weakness. Negative for dizziness, tremors, speech change, focal weakness, seizures and headaches.  Endo/Heme/Allergies: Negative.  Does not bruise/bleed easily.  Psychiatric/Behavioral: Positive for memory loss. Negative for depression, hallucinations and suicidal ideas.    Tolerating Diet: yes      DRUG ALLERGIES:   Allergies  Allergen Reactions  . Atorvastatin Swelling    Swelling at higher doses  . Metformin And Related     VITALS:  Blood pressure (!) 129/46, pulse 63, temperature 98.6 F (37 C), temperature source Oral, resp. rate 16, height 5\' 6"  (1.676 m), weight 73.9 kg (163 lb), SpO2 94 %.  PHYSICAL EXAMINATION:   Physical Exam    LABORATORY PANEL:   CBC  Recent Labs Lab 01/06/16 0543  WBC 6.2  HGB 10.9*  HCT 31.6*  PLT 188   ------------------------------------------------------------------------------------------------------------------  Chemistries   Recent Labs Lab 01/05/16 1941 01/06/16 0543  NA 136  139  K 4.2 4.0  CL 104 108  CO2 25 26  GLUCOSE 187* 90  BUN 22* 18  CREATININE 1.18* 1.06*  CALCIUM 8.8* 8.5*  AST 37  --   ALT 23  --   ALKPHOS 57  --   BILITOT <0.1*  --    ------------------------------------------------------------------------------------------------------------------  Cardiac Enzymes  Recent Labs Lab 01/05/16 1941  TROPONINI <0.03   ------------------------------------------------------------------------------------------------------------------  RADIOLOGY:  Dg Chest 2 View  Result Date: 01/06/2016 CLINICAL DATA:  Cough for 2 days.  Altered mental status. EXAM: CHEST  2 VIEW COMPARISON:  04/24/2014 FINDINGS: The cardiomediastinal contours are normal. There is atherosclerosis of the thoracic aorta. Mild bronchial thickening. No consolidation, pleural effusion, or pneumothorax. No acute osseous abnormalities are seen. Chronic degenerative change of both shoulders. IMPRESSION: 1. Mild bronchial thickening. 2. Thoracic aortic atherosclerosis. Electronically Signed   By: Rubye OaksMelanie  Ehinger M.D.   On: 01/06/2016 00:18   Ct Head Wo Contrast  Result Date: 01/06/2016 CLINICAL DATA:  Initial evaluation for increased adhesion. EXAM: CT HEAD WITHOUT CONTRAST TECHNIQUE: Contiguous axial images were obtained from the base of the skull through the vertex without intravenous contrast. COMPARISON:  Prior CT from 01/05/2015. FINDINGS: Brain: Mild age-related cerebral atrophy with chronic microvascular ischemic disease. No acute intracranial hemorrhage. No evidence for acute large vessel territory infarct. No mass lesion, midline shift or mass effect. Ventricular prominence related to global parenchymal volume loss without hydrocephalus. No extra-axial fluid collection. Vascular: No hyperdense vessel. Scattered vascular calcifications within the carotid siphons. Skull: Scalp soft tissues demonstrate no acute abnormality. Calvarium intact. Sinuses/Orbits: Globes and orbits within  normal limits. Paranasal sinuses are clear. No mastoid effusion. Other: No  other significant finding. IMPRESSION: 1. No acute intracranial process. 2. Mild age-related cerebral atrophy with chronic microvascular ischemic disease. Electronically Signed   By: Rise Mu M.D.   On: 01/06/2016 00:32     ASSESSMENT AND PLAN:   82 year old female with a history of hypothyroidism and dementia who presents with metabolic encephalopathy and generalized weakness.  1. Metabolic encephalopathy in the setting of urinary tract infection and dehydration with acute kidney injury. This is resolving.  2. Acute kidney injury: Creatinine has improved with IV fluids. Acute kidney injury is thought to be due to diarrhea.  3. Urinary tract infection: Patient is started on Rocephin and will really discharge on Ceftin.  4. Dementia: She will continue Aricept and Namenda.  5. Essential hypertension: Continue Cozaar.  6. Hypothyroid: Continue Synthroid.  7. Diarrhea: This is resolved.  8. Weakness, generalized: Physical therapy for disposition.         Management plans discussed with the patient and daughter and sheis in agreement.  CODE STATUS: Full   TOTAL TIME TAKING CARE OF THIS PATIENT: 30 minutes   carotid with nursing staff  POSSIBLE D/C today , DEPENDING ON CLINICAL CONDITION.   Afrika Brick M.D on 01/06/2016 at 11:20 AM  Between 7am to 6pm - Pager - [phone] After 6pm go to [email or web address] - password [email or web address]  [facility]  Office  [phone]  CC: Primary care physician; Patrice Paradise, MD  Note: This dictation was prepared with Dragon dictation along with smaller phrase technology. Any transcriptional errors that result from this process are unintentional.

## 2016-01-06 NOTE — H&P (Signed)
[facility] [place] [facility]   PATIENT NAME: Sara CrandallHilda Delgado    MR#:  [id number]  DATE OF BIRTH:  Mar 12, 1927  DATE OF ADMISSION:  01/05/2016  PRIMARY CARE PHYSICIAN: Patrice ParadiseMCLAUGHLIN, MIRIAM K, MD   REQUESTING/REFERRING PHYSICIAN:   CHIEF COMPLAINT:   Chief Complaint  Patient presents with  . Cough  . Altered Mental Status    HISTORY OF PRESENT ILLNESS: Sara CrandallHilda Delgado  is a 82 y.o. female with a known history of dementia, type 2 diabetes mellitus, hypertension, hypothyroidism presented to the emergency room with confusion and gait imbalance. According to patient's daughter who lives close by patient has cough which is dry in nature for the last few days. She appears to be lethargic and confused since yesterday and does not move around well at home. No history of any fever or chills. Had an episode of diarrhea on Wednesday which is resolved . No history of recent travel or sick contacts at home. Patient started on IV fluid and hospitalist service was consulted for further care of the patient. According to the family member she has been unsteady on her feet.  PAST MEDICAL HISTORY:   Past Medical History:  Diagnosis Date  . Dementia   . Diabetes mellitus without complication (HCC)   . Hypertension   . Hypothyroidism     PAST SURGICAL HISTORY: Past Surgical History:  Procedure Laterality Date  . APPENDECTOMY    . CHOLECYSTECTOMY    . EYE SURGERY    . MELANOMA EXCISION    . ORIF ANKLE FRACTURE Left 01/06/2015   Procedure: OPEN REDUCTION INTERNAL FIXATION (ORIF) ANKLE FRACTURE;  Surgeon: Kennedy BuckerMichael Menz, MD;  Location: [facility] ORS;  Service: Orthopedics;  Laterality: Left;  . TONSILLECTOMY      SOCIAL HISTORY:  Social History  Substance Use Topics  . Smoking status: Never Smoker  . Smokeless tobacco: Never Used  . Alcohol use No    FAMILY HISTORY:  Family History  Problem Relation Age of Onset  . CAD Mother   . CAD Father   . Dementia Sister   . CAD  Brother   . Cancer Brother     DRUG ALLERGIES:  Allergies  Allergen Reactions  . Atorvastatin Swelling    Swelling at higher doses  . Metformin And Related     REVIEW OF SYSTEMS:   CONSTITUTIONAL: No fever, has weakness.  EYES: No blurred or double vision.  EARS, NOSE, AND THROAT: No tinnitus or ear pain.  RESPIRATORY: No cough, shortness of breath, wheezing or hemoptysis.  CARDIOVASCULAR: No chest pain, orthopnea, edema.  GASTROINTESTINAL: No nausea, vomiting,  abdominal pain.  Had an episode of diarrhoea. GENITOURINARY: No dysuria, hematuria.  ENDOCRINE: No polyuria, nocturia,  HEMATOLOGY: No anemia, easy bruising or bleeding SKIN: No rash or lesion. MUSCULOSKELETAL: No joint pain or arthritis.   Has unsteady gait NEUROLOGIC: No tingling, numbness, weakness.  PSYCHIATRY: No anxiety or depression.   MEDICATIONS AT HOME:  Prior to Admission medications   Medication Sig Start Date End Date Taking? Authorizing Provider  amLODipine (NORVASC) 10 MG tablet Take 10 mg by mouth daily.   Yes Historical Provider, MD  aspirin 81 MG tablet Take 81 mg by mouth daily.   Yes Historical Provider, MD  atorvastatin (LIPITOR) 10 MG tablet Take 10 mg by mouth daily.   Yes Historical Provider, MD  calcium-vitamin D (OSCAL WITH D) 500-200 MG-UNIT per tablet Take 2 tablets by mouth 2 (two) times daily. With meals   Yes Historical  Provider, MD  cholestyramine Lanetta Inch) 4 G packet Take 4 g by mouth 2 (two) times daily.   Yes Historical Provider, MD  citalopram (CELEXA) 20 MG tablet Take 20 mg by mouth daily.   Yes Historical Provider, MD  co-enzyme Q-10 30 MG capsule Take 100 mg by mouth daily.   Yes Historical Provider, MD  donepezil (ARICEPT) 10 MG tablet Take 10 mg by mouth at bedtime.   Yes Historical Provider, MD  levothyroxine (SYNTHROID, LEVOTHROID) 125 MCG tablet Take 125 mcg by mouth daily before breakfast. 6 am 01/06/15  Yes Historical Provider, MD  loperamide (IMODIUM A-D) 2 MG tablet  Take 2 mg by mouth 4 (four) times daily as needed for diarrhea or loose stools.   Yes Historical Provider, MD  losartan (COZAAR) 100 MG tablet Take 100 mg by mouth daily.   Yes Historical Provider, MD  magnesium oxide (MAG-OX) 400 MG tablet Take 400 mg by mouth daily.   Yes Historical Provider, MD  memantine (NAMENDA) 5 MG tablet Take 5 mg by mouth.   Yes Historical Provider, MD  Multiple Vitamins-Minerals (MULTIVITAMIN WITH MINERALS) tablet Take 1 tablet by mouth daily.   Yes Historical Provider, MD  omeprazole (PRILOSEC) 20 MG capsule Take 20 mg by mouth daily.   Yes Historical Provider, MD      PHYSICAL EXAMINATION:   VITAL SIGNS: Blood pressure 135/60, pulse 65, temperature 98.4 F (36.9 C), temperature source Oral, resp. rate 18, height 5\' 6"  (1.676 m), weight 73.9 kg (163 lb), SpO2 96 %.  GENERAL:  82 y.o.-year-old patient lying in the bed with no acute distress.  EYES: Pupils equal, round, reactive to light and accommodation. No scleral icterus. Extraocular muscles intact.  HEENT: Head atraumatic, normocephalic. Oropharynx and nasopharynx clear.  NECK:  Supple, no jugular venous distention. No thyroid enlargement, no tenderness.  LUNGS: Normal breath sounds bilaterally, no wheezing, rales,rhonchi or crepitation. No use of accessory muscles of respiration.  CARDIOVASCULAR: S1, S2 normal. No murmurs, rubs, or gallops.  ABDOMEN: Soft, nontender, nondistended. Bowel sounds present. No organomegaly or mass.  EXTREMITIES: No pedal edema, cyanosis, or clubbing.  NEUROLOGIC: Cranial nerves II through XII are intact. Muscle strength 5/5 in all extremities. Sensation intact. Gait not checked.  PSYCHIATRIC: The patient is alert and oriented x 3.  SKIN: No obvious rash, lesion, or ulcer.   LABORATORY PANEL:   CBC  Recent Labs Lab 01/05/16 1941  WBC 7.0  HGB 11.7*  HCT 33.9*  PLT 198  MCV 90.0  MCH 31.1  MCHC 34.5  RDW 14.0    ------------------------------------------------------------------------------------------------------------------  Chemistries   Recent Labs Lab 01/05/16 1941  NA 136  K 4.2  CL 104  CO2 25  GLUCOSE 187*  BUN 22*  CREATININE 1.18*  CALCIUM 8.8*  AST 37  ALT 23  ALKPHOS 57  BILITOT <0.1*   ------------------------------------------------------------------------------------------------------------------ estimated creatinine clearance is 34.5 mL/min (by C-G formula based on SCr of 1.18 mg/dL (H)). ------------------------------------------------------------------------------------------------------------------ No results for input(s): TSH, T4TOTAL, T3FREE, THYROIDAB in the last 72 hours.  Invalid input(s): FREET3   Coagulation profile No results for input(s): INR, PROTIME in the last 168 hours. ------------------------------------------------------------------------------------------------------------------- No results for input(s): DDIMER in the last 72 hours. -------------------------------------------------------------------------------------------------------------------  Cardiac Enzymes  Recent Labs Lab 01/05/16 1941  TROPONINI <0.03   ------------------------------------------------------------------------------------------------------------------ Invalid input(s): POCBNP  ---------------------------------------------------------------------------------------------------------------  Urinalysis    Component Value Date/Time   COLORURINE YELLOW (A) 01/05/2016 2206   APPEARANCEUR HAZY (A) 01/05/2016 2206   APPEARANCEUR Hazy 04/24/2014 1737  LABSPEC 1.016 01/05/2016 2206   LABSPEC 1.014 04/24/2014 1737   PHURINE 5.0 01/05/2016 2206   GLUCOSEU NEGATIVE 01/05/2016 2206   GLUCOSEU Negative 04/24/2014 1737   HGBUR NEGATIVE 01/05/2016 2206   BILIRUBINUR NEGATIVE 01/05/2016 2206   BILIRUBINUR Negative 04/24/2014 1737   KETONESUR NEGATIVE 01/05/2016 2206    PROTEINUR 30 (A) 01/05/2016 2206   NITRITE NEGATIVE 01/05/2016 2206   LEUKOCYTESUR 1+ (A) 01/05/2016 2206   LEUKOCYTESUR 2+ 04/24/2014 1737     RADIOLOGY: Dg Chest 2 View  Result Date: 01/06/2016 CLINICAL DATA:  Cough for 2 days.  Altered mental status. EXAM: CHEST  2 VIEW COMPARISON:  04/24/2014 FINDINGS: The cardiomediastinal contours are normal. There is atherosclerosis of the thoracic aorta. Mild bronchial thickening. No consolidation, pleural effusion, or pneumothorax. No acute osseous abnormalities are seen. Chronic degenerative change of both shoulders. IMPRESSION: 1. Mild bronchial thickening. 2. Thoracic aortic atherosclerosis. Electronically Signed   By: Rubye Oaks M.D.   On: 01/06/2016 00:18   Ct Head Wo Contrast  Result Date: 01/06/2016 CLINICAL DATA:  Initial evaluation for increased adhesion. EXAM: CT HEAD WITHOUT CONTRAST TECHNIQUE: Contiguous axial images were obtained from the base of the skull through the vertex without intravenous contrast. COMPARISON:  Prior CT from 01/05/2015. FINDINGS: Brain: Mild age-related cerebral atrophy with chronic microvascular ischemic disease. No acute intracranial hemorrhage. No evidence for acute large vessel territory infarct. No mass lesion, midline shift or mass effect. Ventricular prominence related to global parenchymal volume loss without hydrocephalus. No extra-axial fluid collection. Vascular: No hyperdense vessel. Scattered vascular calcifications within the carotid siphons. Skull: Scalp soft tissues demonstrate no acute abnormality. Calvarium intact. Sinuses/Orbits: Globes and orbits within normal limits. Paranasal sinuses are clear. No mastoid effusion. Other: No other significant finding. IMPRESSION: 1. No acute intracranial process. 2. Mild age-related cerebral atrophy with chronic microvascular ischemic disease. Electronically Signed   By: Rise Mu M.D.   On: 01/06/2016 00:32    EKG: Orders placed or performed during  the hospital encounter of 01/05/16  . EKG 12-Lead  . EKG 12-Lead    IMPRESSION AND PLAN: 82 year old female patient with history of diabetes mellitus type 2, hypertension, hypothyroidism, dementia presented to the emergency room with gait imbalance and occasional cough. Admitting diagnosis 1. Gait instability 2. Dehydration 3. Uncontrolled diabetes mellitus 4. Dementia 5. Hypertension Treatment plan Admit patient to medical floor observation bed IV fluid hydration Physical therapy evaluation Control diabetes mellitus Supportive care  All the records are reviewed and case discussed with ED provider. Management plans discussed with the patient, family and they are in agreement.  CODE STATUS:FULL Code Status History    Date Active Date Inactive Code Status Order ID Comments User Context   01/06/2015  4:07 PM 01/09/2015  5:02 PM Full Code [id number]  Kennedy Bucker, MD Inpatient   01/06/2015  1:49 AM 01/06/2015  4:07 PM Full Code [id number]  Crissie Figures, MD Inpatient    Advance Directive Documentation   Flowsheet Row Most Recent Value  Type of Advance Directive  Living will, Healthcare Power of Attorney  Pre-existing out of facility DNR order (yellow form or pink MOST form)  No data  "MOST" Form in Place?  No data       TOTAL TIME TAKING CARE OF THIS PATIENT: 50 minutes.    Ihor Austin M.D on 01/06/2016 at 2:54 AM  Between 7am to 6pm - Pager - [phone]  After 6pm go to [email or web address] - password [facility] Hospitalists  Office  [phone]  CC: Primary care physician; Patrice Paradise, MD

## 2016-01-07 LAB — URINE CULTURE

## 2016-05-06 ENCOUNTER — Other Ambulatory Visit: Payer: Self-pay | Admitting: Physician Assistant

## 2016-05-06 DIAGNOSIS — R9389 Abnormal findings on diagnostic imaging of other specified body structures: Secondary | ICD-10-CM

## 2016-05-31 ENCOUNTER — Ambulatory Visit
Admission: RE | Admit: 2016-05-31 | Discharge: 2016-05-31 | Disposition: A | Discharge status: Home / Self Care | Payer: Medicare Other | Source: Ambulatory Visit | Attending: Physician Assistant | Admitting: Physician Assistant

## 2016-05-31 DIAGNOSIS — R9389 Abnormal findings on diagnostic imaging of other specified body structures: Secondary | ICD-10-CM

## 2016-05-31 DIAGNOSIS — M4802 Spinal stenosis, cervical region: Secondary | ICD-10-CM | POA: Diagnosis not present

## 2016-05-31 DIAGNOSIS — R938 Abnormal findings on diagnostic imaging of other specified body structures: Secondary | ICD-10-CM | POA: Diagnosis not present

## 2016-06-05 ENCOUNTER — Other Ambulatory Visit: Payer: Self-pay | Admitting: Physician Assistant

## 2016-06-05 DIAGNOSIS — M4802 Spinal stenosis, cervical region: Secondary | ICD-10-CM

## 2016-06-05 DIAGNOSIS — M4310 Spondylolisthesis, site unspecified: Secondary | ICD-10-CM

## 2016-06-05 DIAGNOSIS — M9981 Other biomechanical lesions of cervical region: Secondary | ICD-10-CM

## 2016-06-14 ENCOUNTER — Ambulatory Visit
Admission: RE | Admit: 2016-06-14 | Discharge: 2016-06-14 | Disposition: A | Discharge status: Home / Self Care | Payer: Medicare Other | Source: Ambulatory Visit | Attending: Physician Assistant | Admitting: Physician Assistant

## 2016-06-14 DIAGNOSIS — M4310 Spondylolisthesis, site unspecified: Secondary | ICD-10-CM | POA: Insufficient documentation

## 2016-06-14 DIAGNOSIS — M4802 Spinal stenosis, cervical region: Secondary | ICD-10-CM | POA: Diagnosis not present

## 2016-06-14 DIAGNOSIS — M9981 Other biomechanical lesions of cervical region: Secondary | ICD-10-CM

## 2016-06-14 MED ORDER — GADOBENATE DIMEGLUMINE 529 MG/ML IV SOLN
8.0000 mL | Freq: Once | INTRAVENOUS | Status: AC | PRN
  Administered 2016-06-14: 7 mL via INTRAVENOUS

## 2016-07-16 ENCOUNTER — Emergency Department: Payer: Medicare Other

## 2016-07-16 DIAGNOSIS — I10 Essential (primary) hypertension: Secondary | ICD-10-CM | POA: Diagnosis not present

## 2016-07-16 DIAGNOSIS — Z7982 Long term (current) use of aspirin: Secondary | ICD-10-CM | POA: Insufficient documentation

## 2016-07-16 DIAGNOSIS — S7001XA Contusion of right hip, initial encounter: Secondary | ICD-10-CM | POA: Diagnosis not present

## 2016-07-16 DIAGNOSIS — E119 Type 2 diabetes mellitus without complications: Secondary | ICD-10-CM | POA: Insufficient documentation

## 2016-07-16 DIAGNOSIS — W1839XA Other fall on same level, initial encounter: Secondary | ICD-10-CM | POA: Insufficient documentation

## 2016-07-16 DIAGNOSIS — Z79899 Other long term (current) drug therapy: Secondary | ICD-10-CM | POA: Insufficient documentation

## 2016-07-16 DIAGNOSIS — Y939 Activity, unspecified: Secondary | ICD-10-CM | POA: Diagnosis not present

## 2016-07-16 DIAGNOSIS — Y929 Unspecified place or not applicable: Secondary | ICD-10-CM | POA: Insufficient documentation

## 2016-07-16 DIAGNOSIS — Y999 Unspecified external cause status: Secondary | ICD-10-CM | POA: Insufficient documentation

## 2016-07-16 DIAGNOSIS — S79911A Unspecified injury of right hip, initial encounter: Secondary | ICD-10-CM | POA: Diagnosis present

## 2016-07-16 DIAGNOSIS — E039 Hypothyroidism, unspecified: Secondary | ICD-10-CM | POA: Diagnosis not present

## 2016-07-16 LAB — TROPONIN I: Troponin I: <0.03 ng/mL (ref <0.03)

## 2016-07-16 LAB — CBC
HEMATOCRIT: 35.2 % (ref 35.0–47.0)
HEMOGLOBIN: 12 g/dL (ref 12.0–16.0)
MCH: 31.3 pg (ref 26.0–34.0)
MCHC: 34.1 g/dL (ref 32.0–36.0)
MCV: 91.8 fL (ref 80.0–100.0)
Platelets: 214 10*3/uL (ref 150–440)
RBC: 3.83 MIL/uL (ref 3.80–5.20)
RDW: 13.4 % (ref 11.5–14.5)
WBC: 9.1 10*3/uL (ref 3.6–11.0)

## 2016-07-16 LAB — BASIC METABOLIC PANEL
ANION GAP: 7 (ref 5–15)
BUN: 27 mg/dL — ABNORMAL HIGH (ref 6–20)
CHLORIDE: 102 mmol/L (ref 101–111)
CO2: 27 mmol/L (ref 22–32)
CREATININE: 1.06 mg/dL — AB (ref 0.44–1.00)
Calcium: 9.1 mg/dL (ref 8.9–10.3)
GFR calc Af Amer: 53 mL/min — ABNORMAL LOW (ref >60)
GFR calc non Af Amer: 46 mL/min — ABNORMAL LOW (ref >60)
Glucose, Bld: 154 mg/dL — ABNORMAL HIGH (ref 65–99)
POTASSIUM: 4.4 mmol/L (ref 3.5–5.1)
Sodium: 136 mmol/L (ref 135–145)

## 2016-07-16 NOTE — ED Triage Notes (Signed)
Pt in with co generalized weakness and inability to walk. Pt fell 2 weeks ago and for past week has co right groin pain. Pt family has noticed generalized weakness and decreased mobility.

## 2016-07-17 ENCOUNTER — Emergency Department: Payer: Medicare Other

## 2016-07-17 ENCOUNTER — Emergency Department
Admission: EM | Admit: 2016-07-17 | Discharge: 2016-07-17 | Disposition: A | Discharge status: Home / Self Care | Payer: Medicare Other | Attending: Emergency Medicine | Admitting: Emergency Medicine

## 2016-07-17 DIAGNOSIS — S7001XA Contusion of right hip, initial encounter: Secondary | ICD-10-CM

## 2016-07-17 LAB — URINALYSIS, COMPLETE (UACMP) WITH MICROSCOPIC
Bacteria, UA: NONE SEEN
Bilirubin Urine: NEGATIVE
GLUCOSE, UA: NEGATIVE mg/dL
HGB URINE DIPSTICK: NEGATIVE
Ketones, ur: NEGATIVE mg/dL
Leukocytes, UA: NEGATIVE
Nitrite: NEGATIVE
PROTEIN: 30 mg/dL — AB
Specific Gravity, Urine: 1.021 (ref 1.005–1.030)
pH: 5 (ref 5.0–8.0)

## 2016-07-17 NOTE — ED Notes (Signed)
Pt discharged to home via EMS.  Discharge instructions reviewed with family.  Verbalized understanding.  No questions or concerns at this time.  Teach back verified.  Pt in NAD.  No items left in ED.

## 2016-07-17 NOTE — Discharge Instructions (Signed)
Please use Tylenol 1 g every 6-8 hours for pain control. Please contact her social worker and also your primary physician for further outpatient arrangements as needed. Return emergency Department for altered mental status that's persistent, fever, or any other new concerns.  Please return immediately if condition worsens. Please contact her primary physician or the physician you were given for referral. If you have any specialist physicians involved in her treatment and plan please also contact them. Thank you for using [facility] emergency [facility].

## 2016-07-17 NOTE — ED Provider Notes (Signed)
Time Seen: Approximately *1220  I have reviewed the triage notes  Chief Complaint: Weakness   History of Present Illness: Sara Delgado is a 83 y.o. female  currently fell 2 weeks ago and has complained of some intermittent right hip pain. She has been able to bear weight. She has a history of dementia and is had some occasional altered mental status but no persistent encephalopathy. No fever at home. No obvious head trauma. No complaints of chest or abdominal pain. He's had a previous fracture in her left ankle   Past Medical History:  Diagnosis Date  . Dementia   . Diabetes mellitus without complication (HCC)   . Hypertension   . Hypothyroidism     Patient Active Problem List   Diagnosis Date Noted  . Gait instability 01/06/2016  . Trimalleolar fracture of left ankle 01/05/2015  . Dementia 01/05/2015  . HTN (hypertension) 01/05/2015  . DM (diabetes mellitus) (HCC) 01/05/2015  . HLD (hyperlipidemia) 01/05/2015  . Hypothyroidism 01/05/2015    Past Surgical History:  Procedure Laterality Date  . APPENDECTOMY    . CHOLECYSTECTOMY    . EYE SURGERY    . MELANOMA EXCISION    . ORIF ANKLE FRACTURE Left 01/06/2015   Procedure: OPEN REDUCTION INTERNAL FIXATION (ORIF) ANKLE FRACTURE;  Surgeon: Kennedy Bucker, MD;  Location: [facility] ORS;  Service: Orthopedics;  Laterality: Left;  . TONSILLECTOMY      Past Surgical History:  Procedure Laterality Date  . APPENDECTOMY    . CHOLECYSTECTOMY    . EYE SURGERY    . MELANOMA EXCISION    . ORIF ANKLE FRACTURE Left 01/06/2015   Procedure: OPEN REDUCTION INTERNAL FIXATION (ORIF) ANKLE FRACTURE;  Surgeon: Kennedy Bucker, MD;  Location: [facility] ORS;  Service: Orthopedics;  Laterality: Left;  . TONSILLECTOMY      Current Outpatient Rx  . Order #: [id number] Class: Historical Med  . Order #: [id number] Class: Historical Med  . Order #: [id number] Class: Historical Med  . Order #: [id number] Class: Historical Med  . Order #: [id number] Class: Normal  .  Order #: [id number] Class: Historical Med  . Order #: [id number] Class: Historical Med  . Order #: [id number] Class: Historical Med  . Order #: [id number] Class: Historical Med  . Order #: [id number] Class: Historical Med  . Order #: [id number] Class: Historical Med  . Order #: [id number] Class: Historical Med  . Order #: [id number] Class: Historical Med  . Order #: [id number] Class: Historical Med  . Order #: [id number] Class: Historical Med  . Order #: [id number] Class: Historical Med    Allergies:  Atorvastatin and Metformin and related  Family History: Family History  Problem Relation Age of Onset  . CAD Mother   . CAD Father   . Dementia Sister   . CAD Brother   . Cancer Brother     Social History: Social History  Substance Use Topics  . Smoking status: Never Smoker  . Smokeless tobacco: Never Used  . Alcohol use No     Review of Systems:   10 point review of systems was performed and was otherwise negative: Review of systems was acquired through the patient but also the family due to her dementia Constitutional: No fever Eyes: No visual disturbances ENT: No sore throat, ear pain Cardiac: No chest pain Respiratory: No shortness of breath, wheezing, or stridor Abdomen: No abdominal pain, no vomiting, No diarrhea Endocrine: No weight loss, No night sweats Extremities: No peripheral edema, cyanosis Skin: No rashes, easy bruising Neurologic: No focal weakness, trouble with speech or swollowing Urologic: No dysuria,  Hematuria, or urinary frequency   Physical Exam:  ED Triage Vitals  Enc Vitals Group     BP 07/16/16 2218 (!) 144/47     Pulse Rate 07/16/16 2218 64     Resp 07/16/16 2218 16     Temp 07/16/16 2218 98.3 F (36.8 C)     Temp Source 07/16/16 2218 Oral     SpO2 07/16/16 2218 99 %     Weight 07/16/16 1915 147 lb (66.7 kg)     Height --      Head Circumference --      Peak Flow --      Pain Score --      Pain Loc --      Pain Edu? --      Excl. in GC? --      General: Awake , Alert , and Oriented times 3; GCS 15 Head: Normal cephalic , atraumatic Eyes: Pupils equal , round, reactive to light Nose/Throat: No nasal drainage, patent upper airway without erythema or exudate.  Neck: Supple, Full range of motion, No anterior adenopathy or palpable thyroid masses Lungs: Clear to ascultation without wheezes , rhonchi, or rales Heart: Regular rate, regular rhythm without murmurs , gallops , or rubs Abdomen: Soft, non tender without rebound, guarding , or rigidity; bowel sounds positive and symmetric in all 4 quadrants. No organomegaly .        Extremities:Patient has some tenderness with hip flexion. No tenderness over the knee or ankle region. No crepitus, shortening  Neurologic: n without deficits, sensory intact. Patient was able to transition from the wheelchair into the stretcher. Skin: warm, dry, no rashes   Labs:   All laboratory work was reviewed including any pertinent negatives or positives listed below:  Labs Reviewed  BASIC METABOLIC PANEL - Abnormal; Notable for the following:       Result Value   Glucose, Bld 154 (*)    BUN 27 (*)    Creatinine, Ser 1.06 (*)    GFR calc non Af Amer 46 (*)    GFR calc Af Amer 53 (*)    All other components within normal limits  URINALYSIS, COMPLETE (UACMP) WITH MICROSCOPIC - Abnormal; Notable for the following:    Color, Urine YELLOW (*)    APPearance HAZY (*)    Protein, ur 30 (*)    Squamous Epithelial / LPF 0-5 (*)    All other components within normal limits  CBC  TROPONIN I  Laboratory work was reviewed and showed no clinically significant abnormalities.   EKG:  ED ECG REPORT I, Jennye Moccasin, the attending physician, personally viewed and interpreted this ECG.  Date: 07/17/2016 EKG Time: 1928 Rate: 68 Rhythm: normal sinus rhythm QRS Axis: Left axis deviation Intervals: normal ST/T Wave abnormalities: normal Conduction Disturbances: none Narrative Interpretation:  unremarkable  Poor R-wave progression in the anterior leads. No acute ischemic changes noted  Radiology:  "Ct Hip Right Wo Contrast  Result Date: 07/17/2016 CLINICAL DATA:  Right hip pain. Fall 2 weeks prior. Generalized weakness. Question fracture on radiograph. EXAM: CT OF THE RIGHT HIP WITHOUT CONTRAST TECHNIQUE: Multidetector CT imaging of the right hip was performed according to the standard protocol. Multiplanar CT image reconstructions were also generated. COMPARISON:  Radiographs earlier this day FINDINGS: Bones/Joint/Cartilage No evidence of acute fracture. Particularly, no correlation on the femoral neck corresponding to that seen on radiograph. The pubic rami are intact. Pubic symphysis is congruent. Mild osteoarthritis of the right hip, normal for age.  No evidence of avascular necrosis. No large hip joint effusion. Ligaments Suboptimally assessed by CT. Muscles and Tendons Large intramuscular hematoma. Soft tissues Subcutaneous fat stranding of the soft tissues about the lateral right hip may be contusion. No confluent soft tissue hematoma. Vascular calcifications are seen. IMPRESSION: 1. No CT findings of right hip fracture. Particularly, no fracture corresponding to lucency questioned on radiograph. 2. Subcutaneous soft tissue edema about the lateral hip, may be contusion. Electronically Signed   By: Rubye OaksMelanie  Ehinger M.D.   On: 07/17/2016 01:51   Dg Hip Unilat W Or Wo Pelvis 2-3 Views Right  Result Date: 07/17/2016 CLINICAL DATA:  Acute onset of generalized weakness. Right groin pain. Initial encounter. EXAM: DG HIP (WITH OR WITHOUT PELVIS) 2-3V RIGHT COMPARISON:  None. FINDINGS: There is question of lucency along the right femoral neck. This may be artifactual in nature. This could be further assessed on right hip CT or MRI, as deemed clinically appropriate. Both femoral heads are seated normally within their respective acetabula. No significant degenerative change is appreciated. The  sacroiliac joints are unremarkable in appearance. The visualized bowel gas pattern is grossly unremarkable in appearance. Scattered phleboliths are noted within the pelvis. IMPRESSION: Question of lucency along the right femoral neck, which could be artifactual in nature, or could reflect a nondisplaced fracture. This could be further assessed on right hip CT on MRI, as deemed clinically appropriate. Electronically Signed   By: Roanna RaiderJeffery  Chang M.D.   On: 07/17/2016 00:35  "  I personally reviewed the radiologic studies      ED Course: Patient's stay here was uneventful and her was no significant abnormalities on her labs specifically her urine does not show any evidence of urinary tract infection which has caused her encephalopathy in the past. Patient otherwise seems to be at her baseline according to her daughters who are with her. They do have home help and there's been some discussion for higher level of care.     Assessment: * Contusion of the right hip Dementia   Final Clinical Impression:   Final diagnoses:  Contusion of right hip, initial encounter     Plan:  Outpatient Patient was advised to return immediately if condition worsens. Patient was advised to follow up with their primary care physician or other specialized physicians involved in their outpatient care. The patient and/or family member/power of attorney had laboratory results reviewed at the bedside. All questions and concerns were addressed and appropriate discharge instructions were distributed by the nursing staff.             Jennye MoccasinBrian S Kirill Chatterjee, MD 07/17/16 [phone]

## 2017-04-30 IMAGING — CR DG HIP (WITH OR WITHOUT PELVIS) 2-3V*R*
1 series · 3 of 3 positions shown · non-contrast
Comparison: None.

CLINICAL DATA: Acute onset of generalized weakness. Right groin
pain. Initial encounter.

EXAM:
DG HIP (WITH OR WITHOUT PELVIS) 2-3V RIGHT

[Series 1: dg hip unilat w or w/o pelvis 2-3 views  · non-contrast · 0.14mm/px · 3 of 3 slices shown]
[im 1/3]
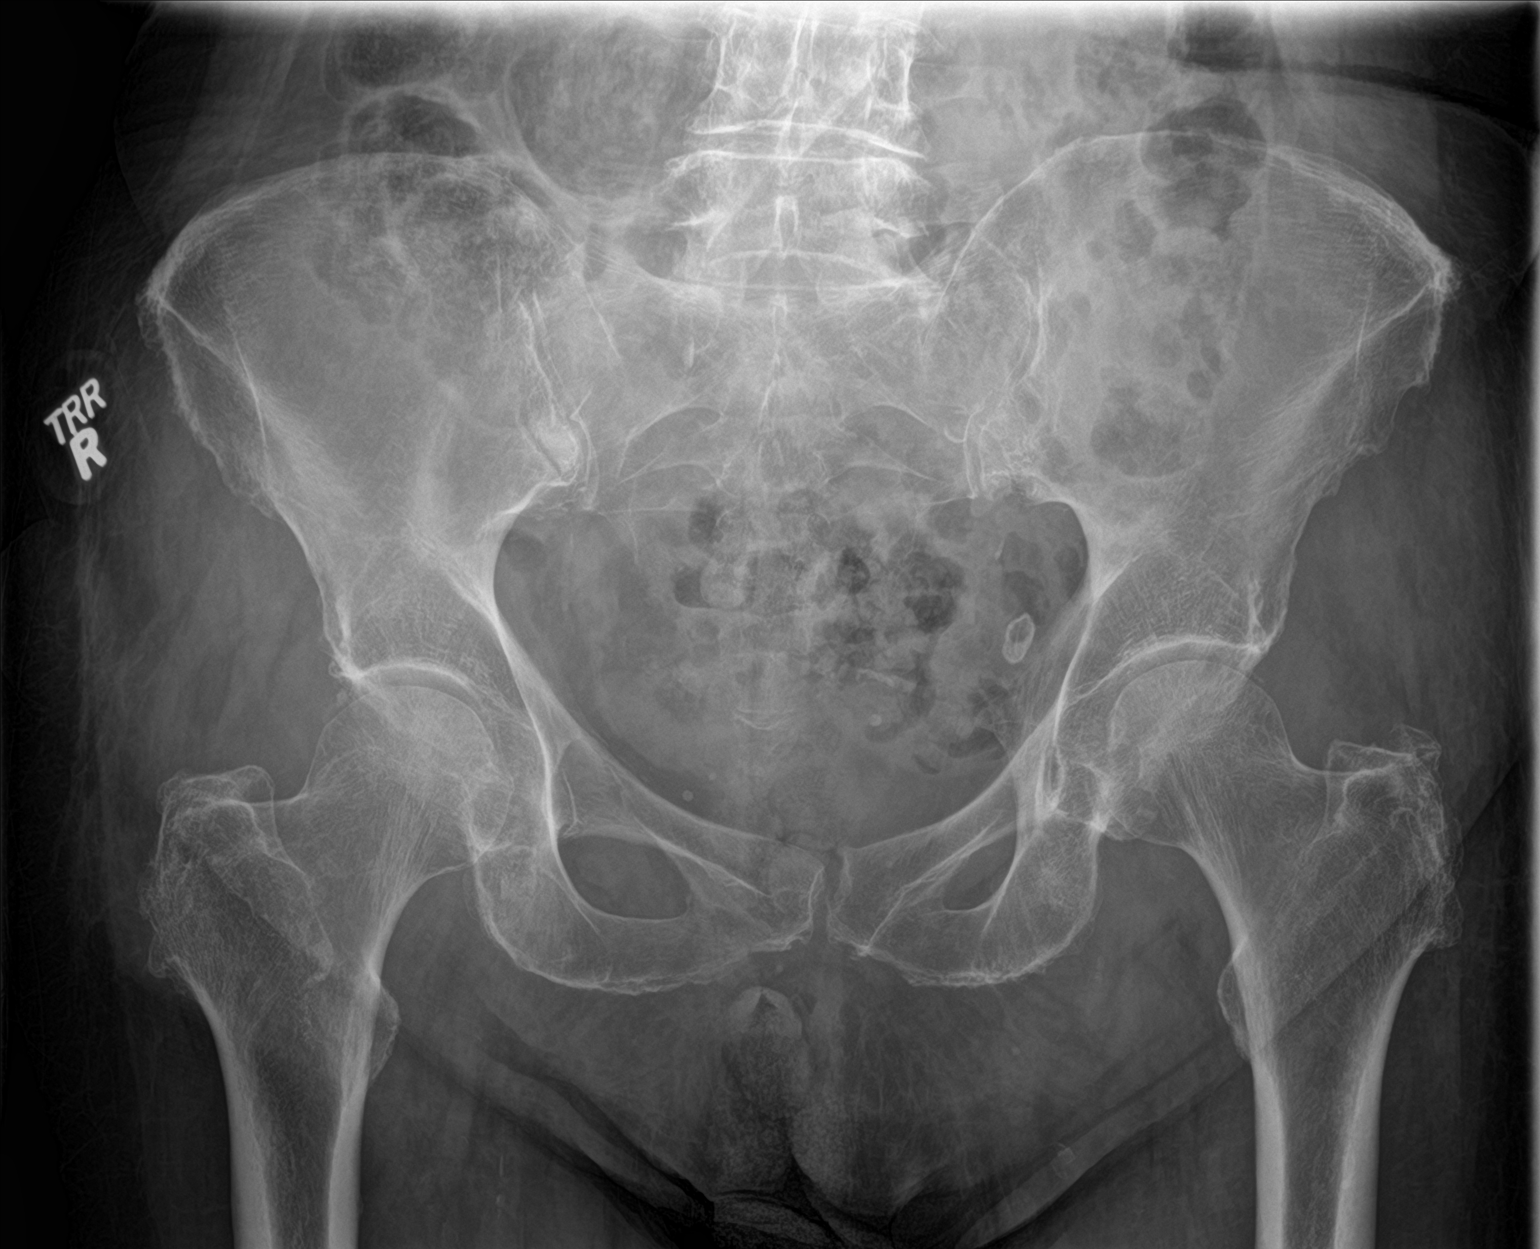
[im 2/3]
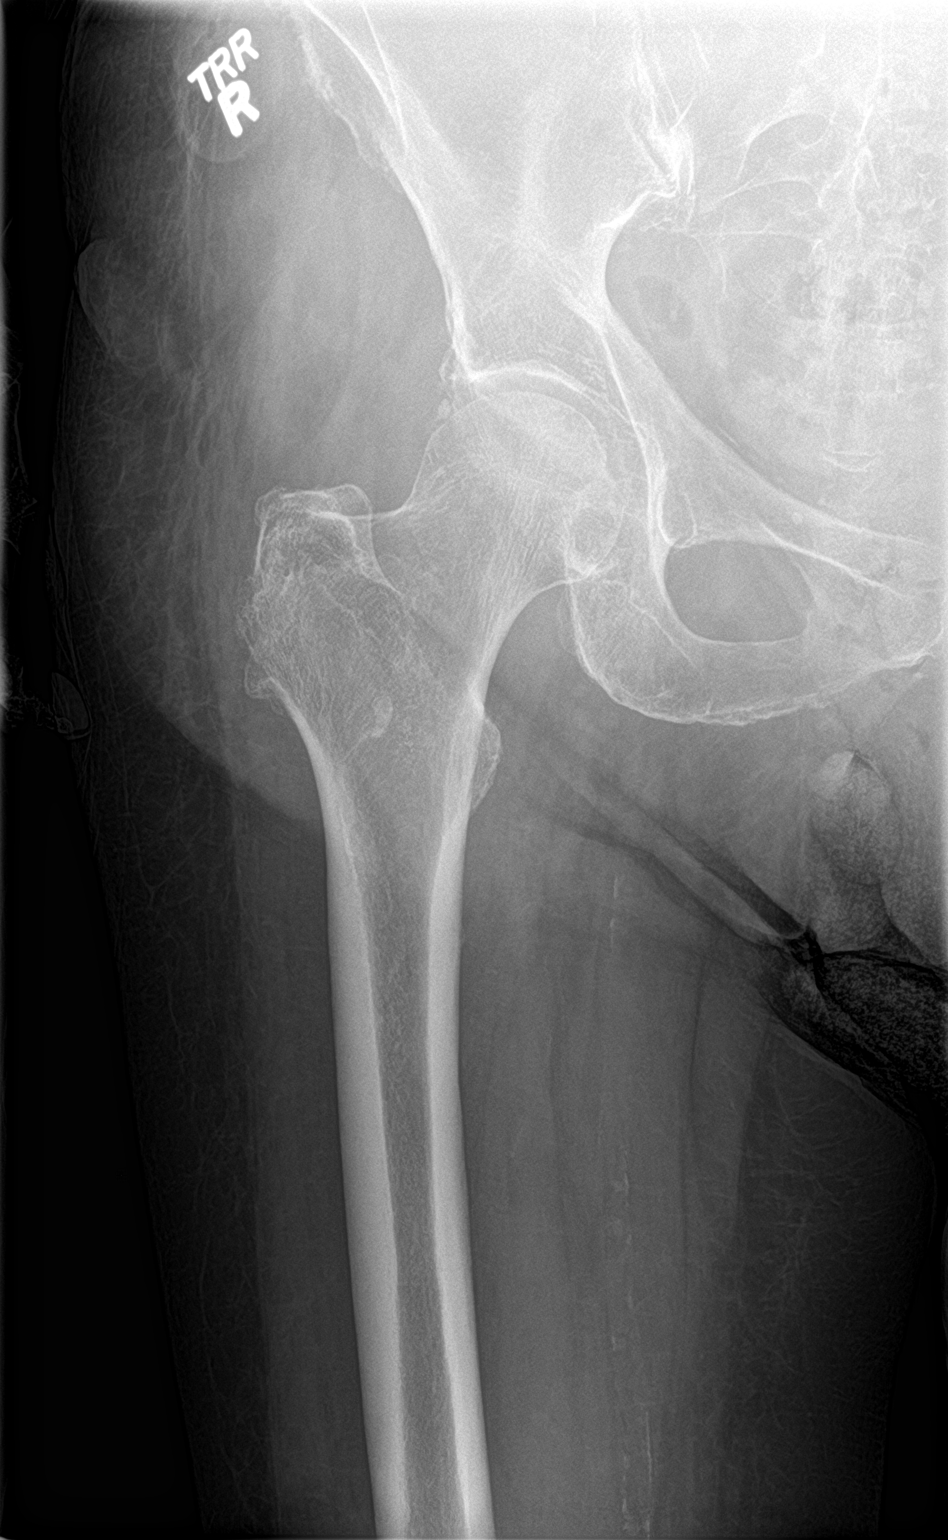
[im 3/3]
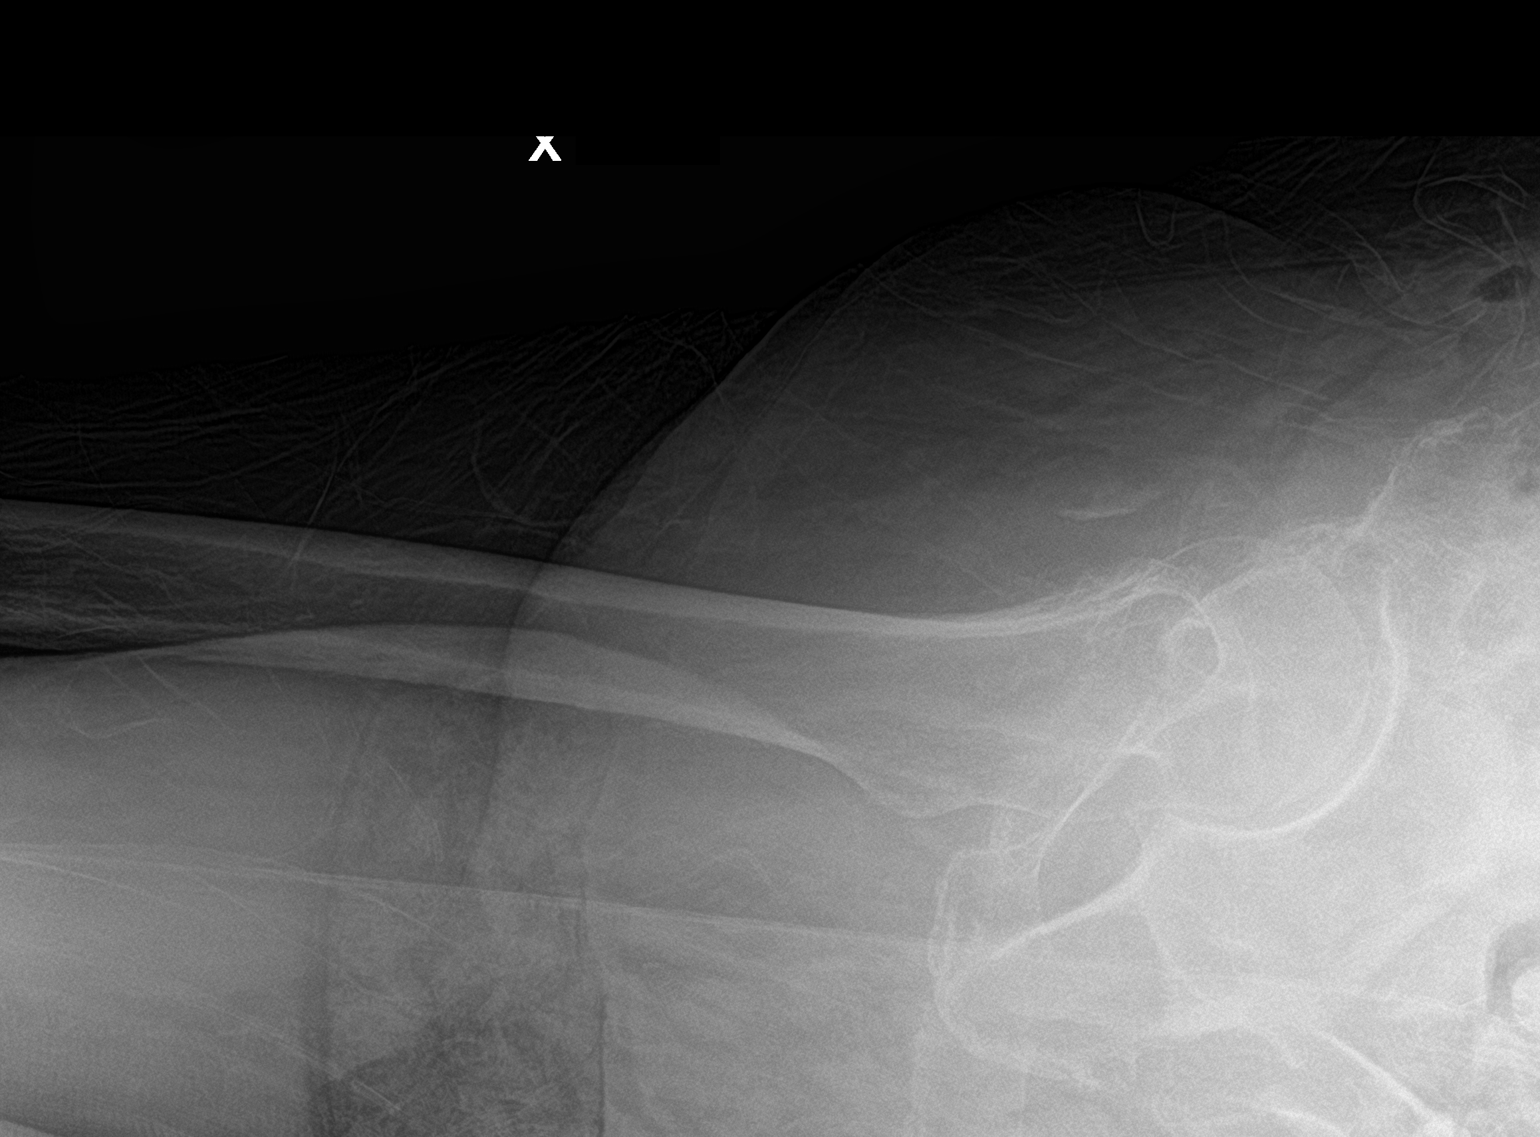

[3 of 3 positions shown; findings below may reference images not displayed]

FINDINGS: There is question of lucency along the right femoral neck. This may
be artifactual in nature. This could be further assessed on right
hip CT or MRI, as deemed clinically appropriate.

Both femoral heads are seated normally within their respective
acetabula. No significant degenerative change is appreciated. The
sacroiliac joints are unremarkable in appearance.

The visualized bowel gas pattern is grossly unremarkable in
appearance. Scattered phleboliths are noted within the pelvis.
IMPRESSION: Question of lucency along the right femoral neck, which could be
artifactual in nature, or could reflect a nondisplaced fracture.
This could be further assessed on right hip CT on MRI, as deemed
clinically appropriate.

## 2017-04-30 IMAGING — CT CT HIP*R* W/O CM
2 of 3 series · 17 of 46 positions shown, 19 images · non-contrast
Comparison: Radiographs earlier this day

CLINICAL DATA: Right hip pain. Fall 2 weeks prior. Generalized
weakness. Question fracture on radiograph.

EXAM:
CT OF THE RIGHT HIP WITHOUT CONTRAST
TECHNIQUE: Multidetector CT imaging of the right hip was performed according to
the standard protocol. Multiplanar CT image reconstructions were
also generated.

[Series 3: axial st · axial · 0.52mm/px · z∈[-1138,-962]mm · 14 of 102 slices shown, 16 images]
[im 7/102  soft-tissue]
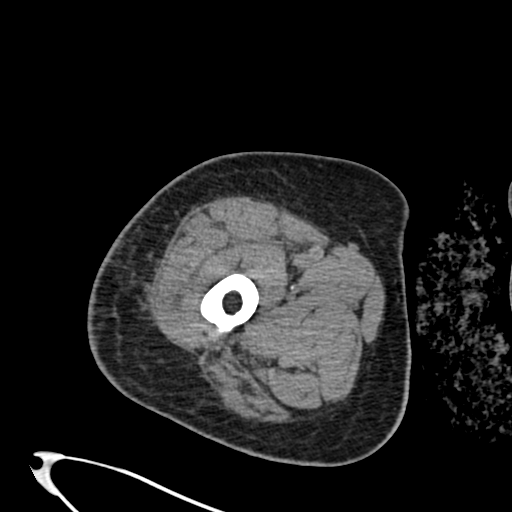
[im 7/102  bone]
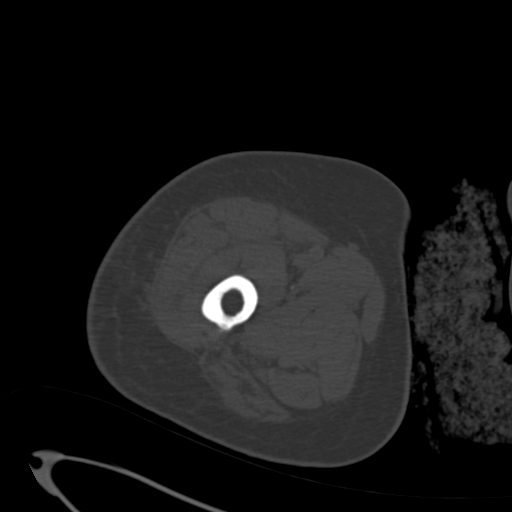
[im 14/102  soft-tissue]
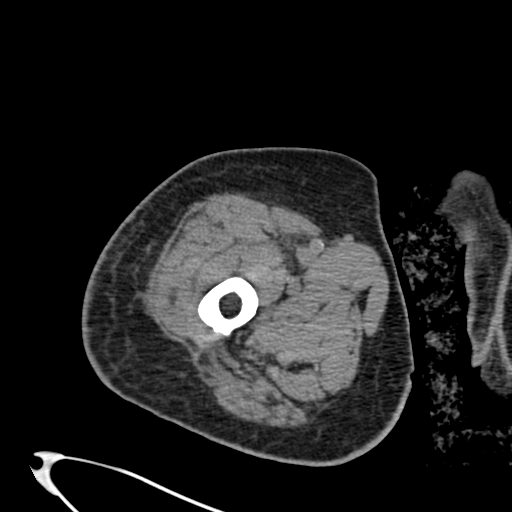
[im 20/102  soft-tissue]
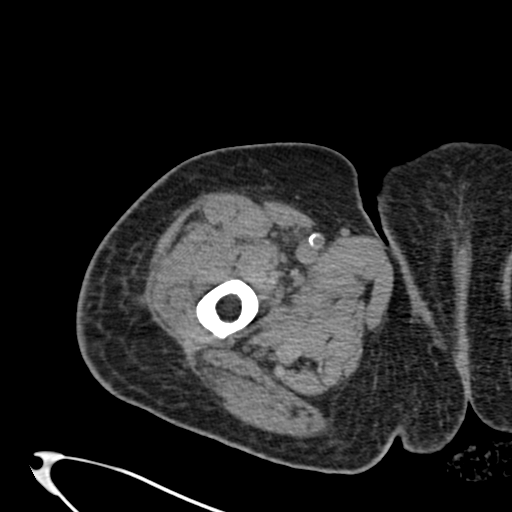
[im 27/102  soft-tissue]
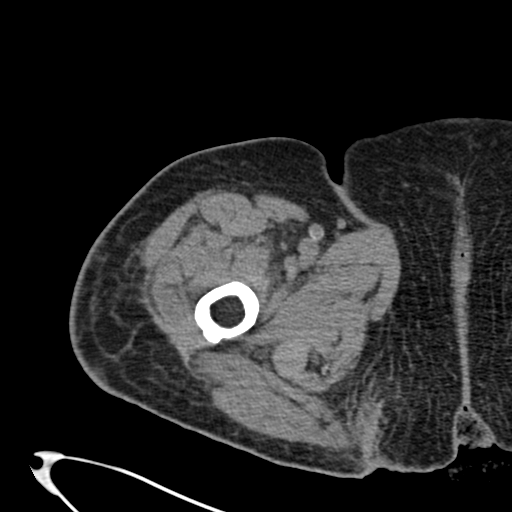
[im 33/102  soft-tissue]
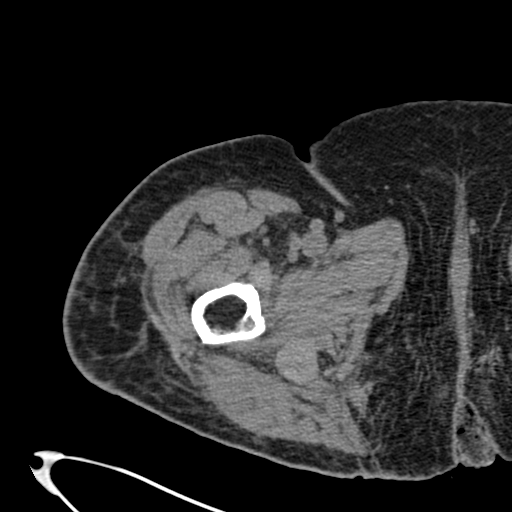
[im 40/102  soft-tissue]
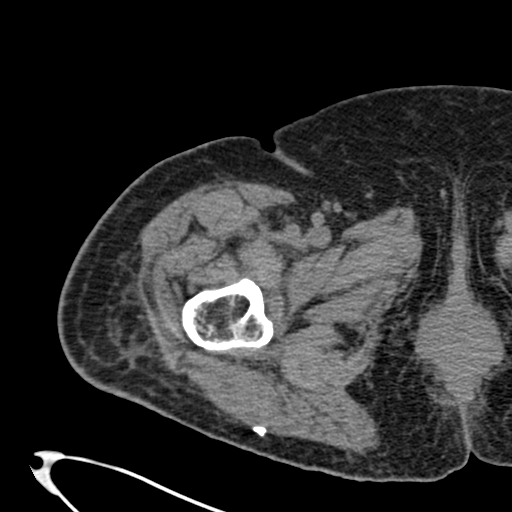
[im 46/102  soft-tissue]
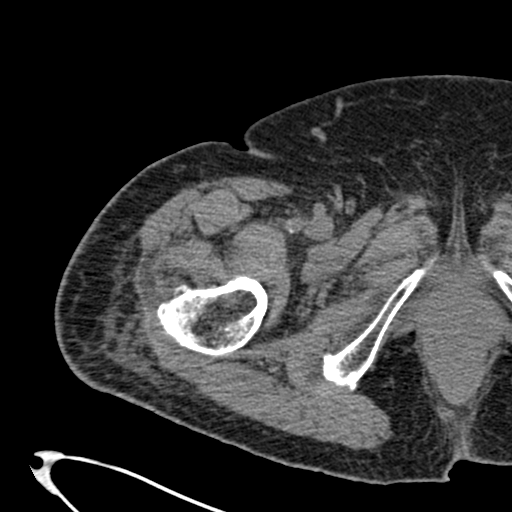
[im 56/102  soft-tissue]
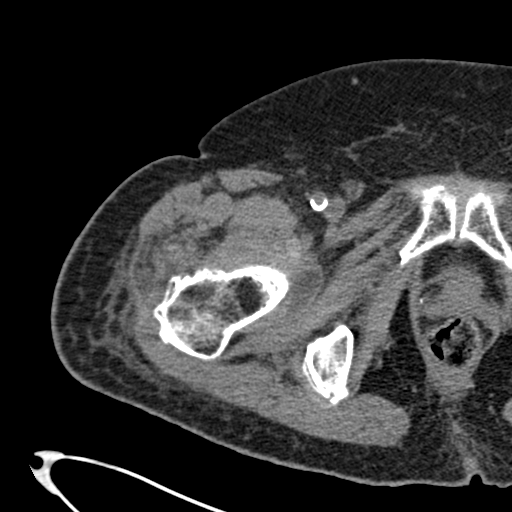
[im 62/102  soft-tissue]
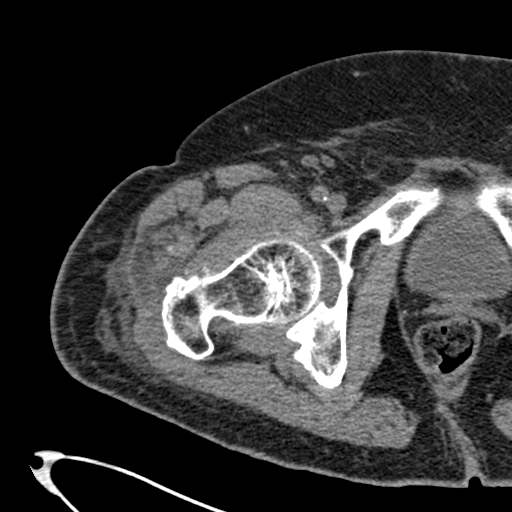
[im 62/102  bone]
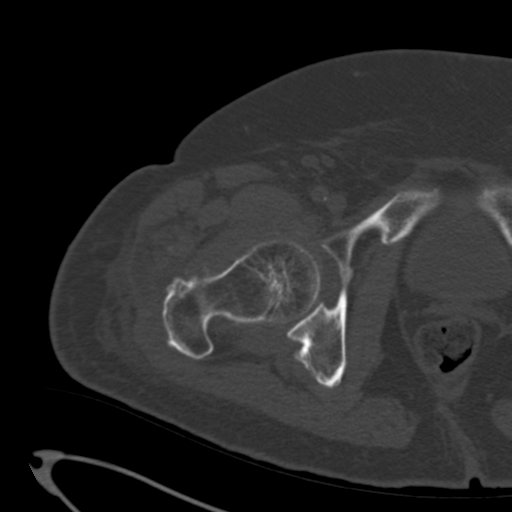
[im 69/102  soft-tissue]
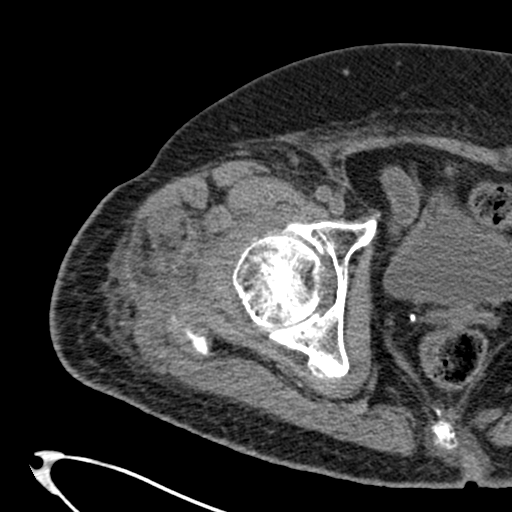
[im 75/102  soft-tissue]
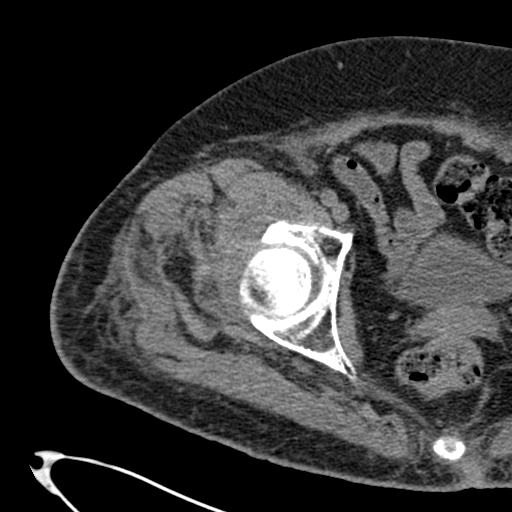
[im 82/102  soft-tissue]
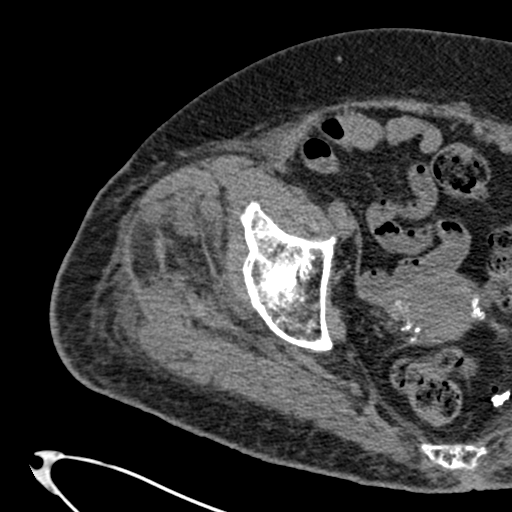
[im 88/102  soft-tissue]
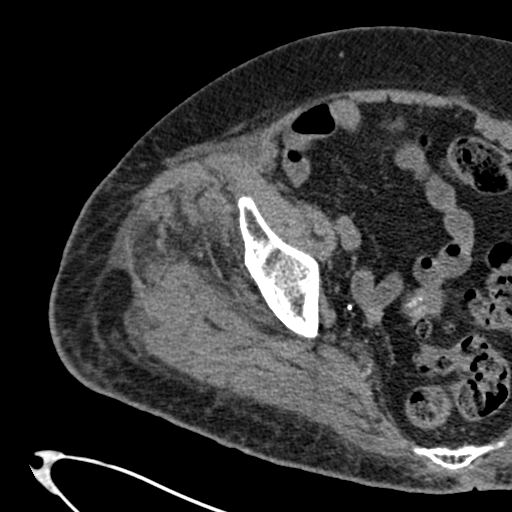
[im 95/102  soft-tissue]
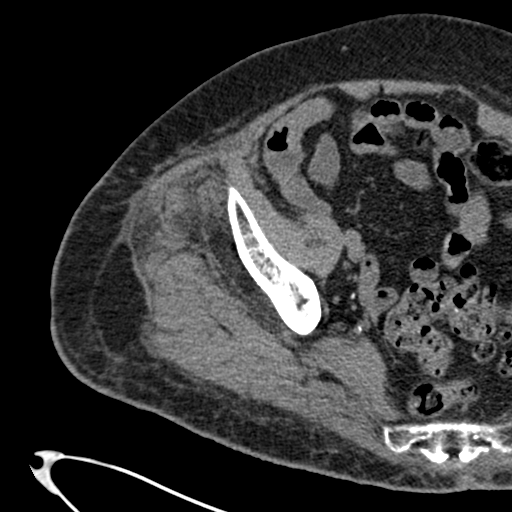

[Series 6: coronal st · coronal · 0.40mm/px · 3 of 116 slices shown]
[im 39/116  soft-tissue]
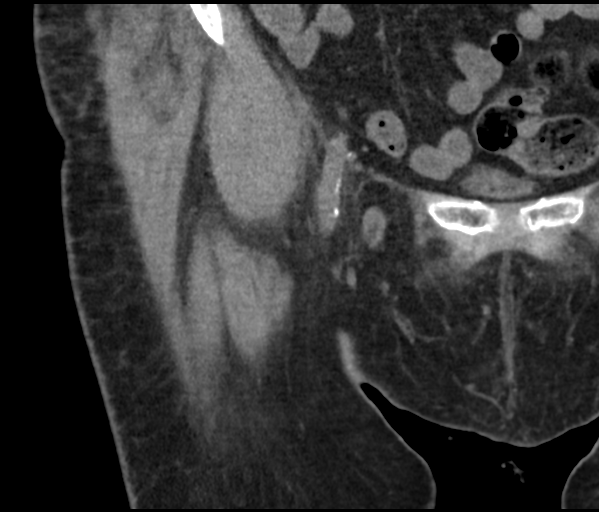
[im 52/116  soft-tissue]
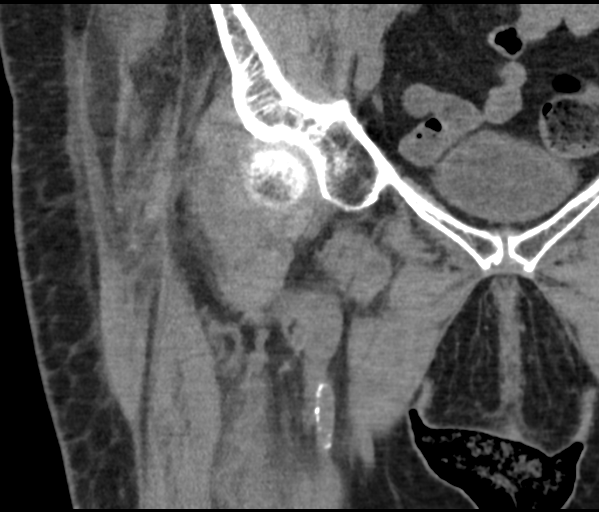
[im 64/116  soft-tissue]
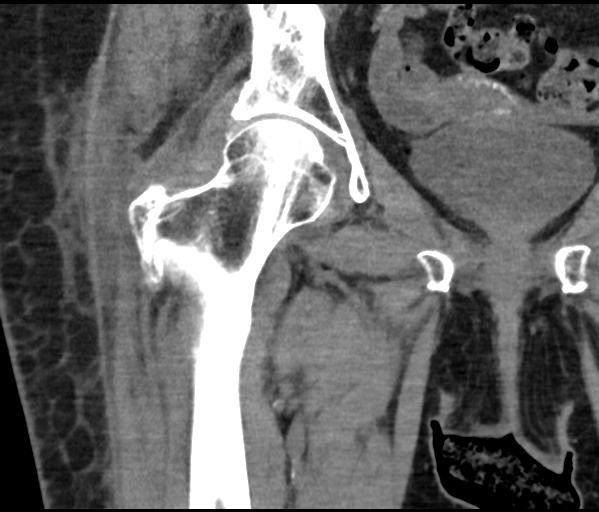

[17 of 46 positions shown; findings below may reference images not displayed]

FINDINGS: Bones/Joint/Cartilage

No evidence of acute fracture. Particularly, no correlation on the
femoral neck corresponding to that seen on radiograph. The pubic
rami are intact. Pubic symphysis is congruent. Mild osteoarthritis
of the right hip, normal for age. No evidence of avascular necrosis.
No large hip joint effusion.

Ligaments

Suboptimally assessed by CT.

Muscles and Tendons

Large intramuscular hematoma.

Soft tissues

Subcutaneous fat stranding of the soft tissues about the lateral
right hip may be contusion. No confluent soft tissue hematoma.
Vascular calcifications are seen.
IMPRESSION: 1. No CT findings of right hip fracture. Particularly, no fracture
corresponding to lucency questioned on radiograph.
2. Subcutaneous soft tissue edema about the lateral hip, may be
contusion.

## 2018-04-22 DEATH — deceased
